# Patient Record
Sex: Male | Born: 1960 | Race: White | Hispanic: No | Smoking: Current every day smoker
Health system: Southern US, Community
[De-identification: ages and names within clinical notes are randomized; demographics above are authoritative.]

## PROBLEM LIST (undated history)

## (undated) DIAGNOSIS — Z950 Presence of cardiac pacemaker: Secondary | ICD-10-CM

## (undated) DIAGNOSIS — I2510 Atherosclerotic heart disease of native coronary artery without angina pectoris: Secondary | ICD-10-CM

## (undated) DIAGNOSIS — R0600 Dyspnea, unspecified: Secondary | ICD-10-CM

## (undated) DIAGNOSIS — J439 Emphysema, unspecified: Secondary | ICD-10-CM

## (undated) DIAGNOSIS — I700 Atherosclerosis of aorta: Secondary | ICD-10-CM

## (undated) DIAGNOSIS — I4891 Unspecified atrial fibrillation: Secondary | ICD-10-CM

## (undated) HISTORY — PX: INGUINAL HERNIA REPAIR: SUR1180

---

## 1985-06-24 HISTORY — PX: KNEE ARTHROSCOPY: SUR90

## 2020-12-29 ENCOUNTER — Observation Stay
Admission: EM | Admit: 2020-12-29 | Discharge: 2020-12-30 | Disposition: A | Discharge status: Home / Self Care | Payer: PRIVATE HEALTH INSURANCE | Attending: Hospitalist | Admitting: Hospitalist

## 2020-12-29 ENCOUNTER — Observation Stay: Payer: PRIVATE HEALTH INSURANCE

## 2020-12-29 ENCOUNTER — Observation Stay
Admit: 2020-12-29 | Discharge: 2020-12-29 | Disposition: A | Discharge status: Home / Self Care | Payer: PRIVATE HEALTH INSURANCE | Attending: Internal Medicine | Admitting: Internal Medicine

## 2020-12-29 ENCOUNTER — Other Ambulatory Visit: Payer: Self-pay

## 2020-12-29 DIAGNOSIS — R55 Syncope and collapse: Principal | ICD-10-CM

## 2020-12-29 DIAGNOSIS — R7401 Elevation of levels of liver transaminase levels: Secondary | ICD-10-CM

## 2020-12-29 DIAGNOSIS — R739 Hyperglycemia, unspecified: Secondary | ICD-10-CM | POA: Diagnosis present

## 2020-12-29 DIAGNOSIS — D72829 Elevated white blood cell count, unspecified: Secondary | ICD-10-CM | POA: Insufficient documentation

## 2020-12-29 DIAGNOSIS — F17200 Nicotine dependence, unspecified, uncomplicated: Secondary | ICD-10-CM | POA: Diagnosis present

## 2020-12-29 DIAGNOSIS — D72823 Leukemoid reaction: Secondary | ICD-10-CM

## 2020-12-29 DIAGNOSIS — F17210 Nicotine dependence, cigarettes, uncomplicated: Secondary | ICD-10-CM | POA: Insufficient documentation

## 2020-12-29 DIAGNOSIS — R748 Abnormal levels of other serum enzymes: Secondary | ICD-10-CM | POA: Insufficient documentation

## 2020-12-29 DIAGNOSIS — R7303 Prediabetes: Secondary | ICD-10-CM | POA: Insufficient documentation

## 2020-12-29 DIAGNOSIS — R778 Other specified abnormalities of plasma proteins: Secondary | ICD-10-CM | POA: Diagnosis present

## 2020-12-29 DIAGNOSIS — Z20822 Contact with and (suspected) exposure to covid-19: Secondary | ICD-10-CM | POA: Insufficient documentation

## 2020-12-29 LAB — CBC
HCT: 39.2 % (ref 39.0–52.0)
Hemoglobin: 13.1 g/dL (ref 13.0–17.0)
MCH: 31.1 pg (ref 26.0–34.0)
MCHC: 33.4 g/dL (ref 30.0–36.0)
MCV: 93.1 fL (ref 80.0–100.0)
Platelets: 211 10*3/uL (ref 150–400)
RBC: 4.21 MIL/uL — ABNORMAL LOW (ref 4.22–5.81)
RDW: 14.1 % (ref 11.5–15.5)
WBC: 15 10*3/uL — ABNORMAL HIGH (ref 4.0–10.5)
nRBC: 0 % (ref 0.0–0.2)

## 2020-12-29 LAB — COMPREHENSIVE METABOLIC PANEL
ALT: 262 U/L — ABNORMAL HIGH (ref 0–44)
AST: 284 U/L — ABNORMAL HIGH (ref 15–41)
Albumin: 3.5 g/dL (ref 3.5–5.0)
Alkaline Phosphatase: 57 U/L (ref 38–126)
Anion gap: 7 (ref 5–15)
BUN: 17 mg/dL (ref 6–20)
CO2: 23 mmol/L (ref 22–32)
Calcium: 8.2 mg/dL — ABNORMAL LOW (ref 8.9–10.3)
Chloride: 106 mmol/L (ref 98–111)
Creatinine, Ser: 1.16 mg/dL (ref 0.61–1.24)
GFR, Estimated: >60 mL/min (ref >60)
Glucose, Bld: 213 mg/dL — ABNORMAL HIGH (ref 70–99)
Potassium: 3.7 mmol/L (ref 3.5–5.1)
Sodium: 136 mmol/L (ref 135–145)
Total Bilirubin: 0.4 mg/dL (ref 0.3–1.2)
Total Protein: 6.2 g/dL — ABNORMAL LOW (ref 6.5–8.1)

## 2020-12-29 LAB — GLUCOSE, CAPILLARY
Glucose-Capillary: 81 mg/dL (ref 70–99)
Glucose-Capillary: 97 mg/dL (ref 70–99)
Glucose-Capillary: 97 mg/dL (ref 70–99)

## 2020-12-29 LAB — ECHOCARDIOGRAM COMPLETE
AR max vel: 4.9 cm2
AV Area VTI: 4.61 cm2
AV Area mean vel: 5.14 cm2
AV Mean grad: 3 mmHg
AV Peak grad: 4.8 mmHg
Ao pk vel: 1.1 m/s
Area-P 1/2: 7.29 cm2
Height: 71 in
MV VTI: 5.13 cm2
S' Lateral: 2.5 cm
Weight: 2400 oz

## 2020-12-29 LAB — MAGNESIUM: Magnesium: 1.9 mg/dL (ref 1.7–2.4)

## 2020-12-29 LAB — HEMOGLOBIN A1C
Hgb A1c MFr Bld: 5.8 % — ABNORMAL HIGH (ref 4.8–5.6)
Mean Plasma Glucose: 119.76 mg/dL

## 2020-12-29 LAB — TROPONIN I (HIGH SENSITIVITY)
Troponin I (High Sensitivity): 108 ng/L (ref <18)
Troponin I (High Sensitivity): 340 ng/L (ref <18)
Troponin I (High Sensitivity): 430 ng/L (ref <18)
Troponin I (High Sensitivity): 312 ng/L (ref <18)

## 2020-12-29 LAB — HIV ANTIBODY (ROUTINE TESTING W REFLEX): HIV Screen 4th Generation wRfx: NONREACTIVE

## 2020-12-29 LAB — RESP PANEL BY RT-PCR (FLU A&B, COVID) ARPGX2
Influenza A by PCR: NEGATIVE
Influenza B by PCR: NEGATIVE
SARS Coronavirus 2 by RT PCR: NEGATIVE

## 2020-12-29 MED ORDER — ACETAMINOPHEN 325 MG PO TABS: 650.0000 mg | ORAL_TABLET | Freq: Four times a day (QID) | ORAL | Status: DC | PRN

## 2020-12-29 MED ORDER — SODIUM CHLORIDE 0.9% FLUSH
3.0000 mL | Freq: Two times a day (BID) | INTRAVENOUS | Status: DC
  Administered 2020-12-29 (×2): 3 mL via INTRAVENOUS

## 2020-12-29 MED ORDER — ASPIRIN 81 MG PO CHEW
324.0000 mg | CHEWABLE_TABLET | Freq: Once | ORAL | Status: AC
  Administered 2020-12-29: 324 mg via ORAL
  Filled 2020-12-29: qty 4

## 2020-12-29 MED ORDER — ACETAMINOPHEN 650 MG RE SUPP: 650.0000 mg | Freq: Four times a day (QID) | RECTAL | Status: DC | PRN

## 2020-12-29 MED ORDER — ONDANSETRON HCL 4 MG/2ML IJ SOLN: 4.0000 mg | Freq: Four times a day (QID) | INTRAMUSCULAR | Status: DC | PRN

## 2020-12-29 MED ORDER — SODIUM CHLORIDE 0.9 % IV BOLUS
1000.0000 mL | Freq: Once | INTRAVENOUS | Status: AC
  Administered 2020-12-29: 1000 mL via INTRAVENOUS

## 2020-12-29 MED ORDER — SODIUM CHLORIDE 0.9 % IV SOLN: INTRAVENOUS | Status: DC

## 2020-12-29 MED ORDER — ONDANSETRON HCL 4 MG PO TABS
4.0000 mg | ORAL_TABLET | Freq: Four times a day (QID) | ORAL | Status: DC | PRN
Start: 2020-12-29 — End: 2020-12-30

## 2020-12-29 MED ORDER — ENOXAPARIN SODIUM 40 MG/0.4ML IJ SOSY
40.0000 mg | PREFILLED_SYRINGE | INTRAMUSCULAR | Status: DC
  Administered 2020-12-29: 40 mg via SUBCUTANEOUS
  Filled 2020-12-29: qty 0.4

## 2020-12-29 MED ORDER — ASPIRIN EC 81 MG PO TBEC
81.0000 mg | DELAYED_RELEASE_TABLET | Freq: Every day | ORAL | Status: DC
  Administered 2020-12-29 – 2020-12-30 (×2): 81 mg via ORAL
  Filled 2020-12-29 (×2): qty 1

## 2020-12-29 NOTE — ED Triage Notes (Addendum)
Pt presents to the ER via EMS s/p a syncopal episode. Per EMS, pt was getting ready for work became dizzy and passed out. Orthostatic hypotensive with EMS. Pt has cardiac hx. Denies CP and SOB prior to incident.

## 2020-12-29 NOTE — ED Notes (Signed)
Report called to Kelly, RN

## 2020-12-29 NOTE — ED Notes (Signed)
ED Tech at bedside for transport

## 2020-12-29 NOTE — ED Notes (Signed)
Pt given urinal.

## 2020-12-29 NOTE — Consult Note (Signed)
[facility] Consultation Note  Patient ID: Eric Allison, MRN: [id number], DOB/AGE: Nov 19, 1960 60 y.o. Admit date: 12/29/2020   Date of Consult: 12/29/2020 Primary Physician: Pcp, No Primary Cardiologist: None  Chief Complaint:  Chief Complaint  Patient presents with   Dizziness   Loss of Consciousness   Reason for Consult:  Syncope  HPI: 60 y.o. male with no evidence of previous cardiovascular risk factors and no evidence of previous cardiovascular events having significant longstanding tobacco abuse without evidence of family history of cardiovascular disease having a syncopal episode.  The patient has been doing a vigorous job with no evidence of concerns of shortness of breath chest pain PND orthopnea dizziness or nausea.  He had come home from his work and was in the yard helping the dogs and had a syncopal episode and did not feel well.  He additionally was trying to get into his jeep and continued to have presyncope and syncope.  There was no evidence of chest pain shortness of breath or prolonged issues at that time.  When seen in the emergency room he had gotten some IV fluids and felt much better and otherwise had no other symptoms since that time.  He has had an echocardiogram showing normal LV systolic function and no evidence of significant valvular heart disease.  Telemetry has shown sinus bradycardia for with first-degree AV block and bundle branch block.  EKG has shown normal sinus rhythm first-degree AV block with left axis deviation and right bundle branch block apparently unchanged by his history for many years of ago.  Troponin has been elevating from 108 up to 430 and now back to 312 possibly consistent with myocardial injury.  Currently the patient feels well  History reviewed. No pertinent past medical history.    Surgical History: History reviewed. No pertinent surgical history.   Home Meds: Prior to Admission medications   Not on File    Inpatient  Medications:   aspirin EC  81 mg Oral Daily   enoxaparin (LOVENOX) injection  40 mg Subcutaneous Q24H   sodium chloride flush  3 mL Intravenous Q12H    sodium chloride 100 mL/hr at 12/29/20 0957    Allergies: Not on File  Social History   Socioeconomic History   Marital status: Divorced    Spouse name: Not on file   Number of children: Not on file   Years of education: Not on file   Highest education level: Not on file  Occupational History   Not on file  Tobacco Use   Smoking status: Every Day    Packs/day: 1.00    Pack years: 0.00    Types: Cigarettes   Smokeless tobacco: Not on file  Substance and Sexual Activity   Alcohol use: Yes    Comment: occ   Drug use: Never   Sexual activity: Not on file  Other Topics Concern   Not on file  Social History Narrative   Not on file   Social Determinants of Health   Financial Resource Strain: Not on file  Food Insecurity: Not on file  Transportation Needs: Not on file  Physical Activity: Not on file  Stress: Not on file  Social Connections: Not on file  Intimate Partner Violence: Not on file     Family History  Problem Relation Age of Onset   Hypertension Mother      Review of Systems Positive for syncope Negative for: General:  chills, fever, night sweats or weight changes.  Cardiovascular: PND orthopnea  positive for syncope negative for dizziness  Dermatological skin lesions rashes Respiratory: Cough congestion Urologic: Frequent urination urination at night and hematuria Abdominal: negative for nausea, vomiting, diarrhea, bright red blood per rectum, melena, or hematemesis Neurologic: negative for visual changes, and/or hearing changes  All other systems reviewed and are otherwise negative except as noted above.  Labs: No results for input(s): CKTOTAL, CKMB, TROPONINI in the last 72 hours. Lab Results  Component Value Date   WBC 15.0 (H) 12/29/2020   HGB 13.1 12/29/2020   HCT 39.2 12/29/2020   MCV 93.1  12/29/2020   PLT 211 12/29/2020    Recent Labs  Lab 12/29/20 0418  NA 136  K 3.7  CL 106  CO2 23  BUN 17  CREATININE 1.16  CALCIUM 8.2*  PROT 6.2*  BILITOT 0.4  ALKPHOS 57  ALT 262*  AST 284*  GLUCOSE 213*   No results found for: CHOL, HDL, LDLCALC, TRIG No results found for: DDIMER  Radiology/Studies:  ECHOCARDIOGRAM COMPLETE  Result Date: 12/29/2020    ECHOCARDIOGRAM REPORT   Patient Name:   Eric Allison Date of Exam: 12/29/2020 Medical Rec #:  [id number]       Height:       71.0 in Accession #:    [id number]      Weight:       150.0 lb Date of Birth:  1960/10/14       BSA:          1.866 m Patient Age:    63 years        BP:           110/72 mmHg Patient Gender: M               HR:           73 bpm. Exam Location:  [facility] Procedure: 2D Echo, Color Doppler and Cardiac Doppler Indications:     R55 Syncope  History:         Patient has no prior history of Echocardiogram examinations.                  Risk Factors:Current Smoker.  Sonographer:     Charmayne Sheer RDCS (AE) Referring Phys:  [id number] Collier Bullock Diagnosing Phys: Serafina Royals MD  Sonographer Comments: Technically challenging study due to limited acoustic windows. Image acquisition challenging due to patient body habitus. IMPRESSIONS  1. Left ventricular ejection fraction, by estimation, is 65 to 70%. The left ventricle has normal function. The left ventricle has no regional wall motion abnormalities. Left ventricular diastolic parameters were normal.  2. Right ventricular systolic function is normal. The right ventricular size is normal.  3. The mitral valve is normal in structure. Trivial mitral valve regurgitation.  4. The aortic valve is normal in structure. Aortic valve regurgitation is trivial. FINDINGS  Left Ventricle: Left ventricular ejection fraction, by estimation, is 65 to 70%. The left ventricle has normal function. The left ventricle has no regional wall motion abnormalities. The left ventricular internal cavity size  was small. There is no left ventricular hypertrophy. Left ventricular diastolic parameters were normal. Right Ventricle: The right ventricular size is normal. No increase in right ventricular wall thickness. Right ventricular systolic function is normal. Left Atrium: Left atrial size was normal in size. Right Atrium: Right atrial size was normal in size. Pericardium: There is no evidence of pericardial effusion. Mitral Valve: The mitral valve is normal in structure. Trivial mitral valve regurgitation. MV peak gradient, 1.6 mmHg.  The mean mitral valve gradient is 1.0 mmHg. Tricuspid Valve: The tricuspid valve is normal in structure. Tricuspid valve regurgitation is trivial. Aortic Valve: The aortic valve is normal in structure. Aortic valve regurgitation is trivial. Aortic valve mean gradient measures 3.0 mmHg. Aortic valve peak gradient measures 4.8 mmHg. Aortic valve area, by VTI measures 4.61 cm. Pulmonic Valve: The pulmonic valve was normal in structure. Pulmonic valve regurgitation is not visualized. Aorta: The aortic root and ascending aorta are structurally normal, with no evidence of dilitation. IAS/Shunts: No atrial level shunt detected by color flow Doppler.  LEFT VENTRICLE PLAX 2D LVIDd:         3.80 cm  Diastology LVIDs:         2.50 cm  LV e' medial:    8.16 cm/s LV PW:         1.20 cm  LV E/e' medial:  7.8 LV IVS:        1.00 cm  LV e' lateral:   9.25 cm/s LVOT diam:     2.90 cm  LV E/e' lateral: 6.9 LV SV:         96 LV SV Index:   52 LVOT Area:     6.61 cm  LEFT ATRIUM         Index LA diam:    2.90 cm 1.55 cm/m  AORTIC VALVE                   PULMONIC VALVE AV Area (Vmax):    4.90 cm    PV Vmax:       1.09 m/s AV Area (Vmean):   5.14 cm    PV Vmean:      73.200 cm/s AV Area (VTI):     4.61 cm    PV VTI:        0.189 m AV Vmax:           110.00 cm/s PV Peak grad:  4.8 mmHg AV Vmean:          77.500 cm/s PV Mean grad:  3.0 mmHg AV VTI:            0.209 m AV Peak Grad:      4.8 mmHg AV Mean Grad:       3.0 mmHg LVOT Vmax:         81.60 cm/s LVOT Vmean:        60.300 cm/s LVOT VTI:          0.146 m LVOT/AV VTI ratio: 0.70  AORTA Ao Root diam: 3.35 cm MITRAL VALVE MV Area (PHT): 7.29 cm    SHUNTS MV Area VTI:   5.13 cm    Systemic VTI:  0.15 m MV Peak grad:  1.6 mmHg    Systemic Diam: 2.90 cm MV Mean grad:  1.0 mmHg MV Vmax:       0.64 m/s MV Vmean:      41.2 cm/s MV Decel Time: 104 msec MV E velocity: 63.80 cm/s MV A velocity: 80.10 cm/s MV E/A ratio:  0.80 Serafina Royals MD Electronically signed by Serafina Royals MD Signature Date/Time: 12/29/2020/1:09:29 PM    Final    US Abdomen Limited RUQ (LIVER/GB)  Result Date: 12/29/2020 CLINICAL DATA:  Transaminitis EXAM: ULTRASOUND ABDOMEN LIMITED RIGHT UPPER QUADRANT COMPARISON:  None. FINDINGS: Gallbladder: No gallstones or wall thickening visualized. No sonographic Murphy sign noted by sonographer. Common bile duct: Diameter: 2.4 mm. Liver: There are 2 simple cyst within the right hepatic lobe both  of which measure up to 1.2 cm in diameter. No solid hepatic lesion identified. Within normal limits in parenchymal echogenicity. Portal vein is patent on color Doppler imaging with normal direction of blood flow towards the liver. Other: None. IMPRESSION: 1. Essentially unremarkable ultrasound of the right upper quadrant. 2. Incidental note of two small simple cysts within the right hepatic lobe. Electronically Signed   By: Davina Poke D.O.   On: 12/29/2020 10:20    EKG: Normal sinus rhythm with first-degree AV block left axis deviation and right bundle branch block  Weights: Filed Weights   12/29/20 0413 12/29/20 1218  Weight: 68 kg 65.2 kg     Physical Exam: Blood pressure 121/73, pulse 62, temperature 98.2 F (36.8 C), resp. rate 18, height 5\' 11"  (1.803 m), weight 65.2 kg, SpO2 99 %. Body mass index is 20.06 kg/m. General: Well developed, well nourished, in no acute distress. Head eyes ears nose throat: Normocephalic, atraumatic, sclera  non-icteric, no xanthomas, nares are without discharge. No apparent thyromegaly and/or mass  Lungs: Normal respiratory effort.  no wheezes, no rales, no rhonchi.  Heart: RRR with normal S1 S2. no murmur gallop, no rub, PMI is normal size and placement, carotid upstroke normal without bruit, jugular venous pressure is normal Abdomen: Soft, non-tender, non-distended with normoactive bowel sounds. No hepatomegaly. No rebound/guarding. No obvious abdominal masses. Abdominal aorta is normal size without bruit Extremities: No edema. no cyanosis, no clubbing, no ulcers  Peripheral : 2+ bilateral upper extremity pulses, 2+ bilateral femoral pulses, 2+ bilateral dorsal pedal pulse Neuro: Alert and oriented. No facial asymmetry. No focal deficit. Moves all extremities spontaneously. Musculoskeletal: Normal muscle tone without kyphosis Psych:  Responds to questions appropriately with a normal affect.    Assessment: 60 year old male with no evidence of previous cardiovascular history or risk factors other than tobacco abuse having syncopal episode with a normal LV function by echocardiogram but elevated troponin now feeling well with an abnormal EKG suspicious for cardiac origin  Plan: 1.  Continue heparin subcutaneously for further risk reduction of deep venous thrombosis as well as myocardial infarction 2.  Continue telemetry for further evaluation treatment options of rhythm disturbances possibly causing above 3.  Further consideration of carotid Doppler for syncopal episode 4.  Begin ambulation and follow-up for improvements of symptoms and possible further investigation as necessary based on potential symptoms and above.  This could include the possibility of cardiac catheterization.  Patient understands risk and benefits of cardiac catheterization this includes the possibility of death stroke heart attack infection bleeding or blood clot.  He is at low risk for conscious sedation  Signed, Corey Skains M.D. [facility] 12/29/2020, 4:52 PM

## 2020-12-29 NOTE — ED Notes (Signed)
Transport requested

## 2020-12-29 NOTE — ED Notes (Addendum)
MD Damita Dunnings aware of troponin increase

## 2020-12-29 NOTE — ED Notes (Signed)
Patient transported to Ultrasound 

## 2020-12-29 NOTE — ED Notes (Signed)
ED Provider at bedside. 

## 2020-12-29 NOTE — H&P (Addendum)
History and Physical    [name] ZLD:[id number] DOB: [birth year] DOA: 12/29/2020  PCP: Pcp, No   Patient coming from: Home  I have personally briefly reviewed patient's old medical records in [place]  Chief Complaint: " I passed out"  HPI: Eric Allison is a 60 y.o. male with no significant past medical history except nicotine dependence who presents to the ER via EMS after he had 2 syncopal episodes at home. 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  He was walking across the lawn to his truck when he suddenly felt dizzy and fell.  He is unsure how long he was out for but woke up and found himself on the ground.  He walked back into the house and while talking to his mother he had another dizzy spell and passed out.  His mother called 2.  When EMS arrived patient was said to have orthostatic blood pressure changes (not documented.)  Patient states he had some urinary incontinence but denies having any tongue bite or fecal incontinence. He denied having any chest pain or shortness of breath prior to his syncopal episodes.  He denies having any palpitations, no nausea, no vomiting, no fever, no chills, no abdominal pain, no urinary symptoms, no diaphoresis, no headache, no blurred vision, no focal deficits. Labs show sodium 136, potassium 3.7, chloride 106, bicarb 23, glucose 213, BUN 17, creatinine 1.16, calcium 8.2, alkaline phosphatase 57, albumin 3.5, AST 284, ALT 262, total protein 6.2, troponin 108 >> 340, white count 15, hemoglobin 13.1, hematocrit 39.2, MCV 93.1, RDW 14.1, platelet count 211 Respiratory viral panel is negative EKG reviewed by me shows sinus rhythm followed by junctional rhythm at times around 82 bpm with a widened QRS, right axis deviation, prolonged PR interval, mild QTC prolongation with nonspecific ST changes.    ED Course: Patient is a 60 year old male who presents to the ER for evaluation of 2 syncopal  episodes.  Patient is noted to have hyperglycemia, transaminitis, elevated troponin and abnormal EKG. He will be referred to observation status for further evaluation.     Review of Systems: As per HPI otherwise all other systems reviewed and negative.    History reviewed. No pertinent past medical history.  History reviewed. No pertinent surgical history.   reports that he has been smoking cigarettes. He has been smoking an average of 1.00 packs per day. He does not have any smokeless tobacco history on file. He reports current alcohol use. He reports that he does not use drugs.  Not on File  Family History  Problem Relation Age of Onset   Hypertension Mother       Prior to Admission medications   Not on File    Physical Exam: Vitals:   12/29/20 0530 12/29/20 0700 12/29/20 0730 12/29/20 0800  BP: 120/71 114/65 112/73 110/72  Pulse: 71 71 70 75  Resp: 18 17 17 18   Temp:      TempSrc:      SpO2: 96% 95% 95% 94%  Weight:      Height:         Vitals:   12/29/20 0530 12/29/20 0700 12/29/20 0730 12/29/20 0800  BP: 120/71 114/65 112/73 110/72  Pulse: 71 71 70 75  Resp: 18 17 17 18   Temp:      TempSrc:      SpO2: 96% 95% 95% 94%  Weight:      Height:  Constitutional: Alert and oriented x 3 . Not in any apparent distress HEENT:      Head: Normocephalic and atraumatic.         Eyes: PERLA, EOMI, Conjunctivae are normal. Sclera is non-icteric.       Mouth/Throat: Mucous membranes are moist.       Neck: Supple with no signs of meningismus. Cardiovascular: Regular rate and rhythm. No murmurs, gallops, or rubs. 2+ symmetrical distal pulses are present . No JVD. No LE edema Respiratory: Respiratory effort normal .Lungs sounds clear bilaterally. No wheezes, crackles, or rhonchi.  Gastrointestinal: Soft, non tender, and non distended with positive bowel sounds.  Genitourinary: No CVA tenderness. Musculoskeletal: Nontender with normal range of motion in all  extremities. No cyanosis, or erythema of extremities. Neurologic:  Face is symmetric. Moving all extremities. No gross focal neurologic deficits . Skin: Skin is warm, dry.  No rash or ulcers.  Skin tears on right forearm and forehead Psychiatric: Mood and affect are normal    Labs on Admission: I have personally reviewed following labs and imaging studies  CBC: Recent Labs  Lab 12/29/20 0418  WBC 15.0*  HGB 13.1  HCT 39.2  MCV 93.1  PLT 353   Basic Metabolic Panel: Recent Labs  Lab 12/29/20 0418  NA 136  K 3.7  CL 106  CO2 23  GLUCOSE 213*  BUN 17  CREATININE 1.16  CALCIUM 8.2*   GFR: Estimated Creatinine Clearance: 65.9 mL/min (by C-G formula based on SCr of 1.16 mg/dL). Liver Function Tests: Recent Labs  Lab 12/29/20 0418  AST 284*  ALT 262*  ALKPHOS 57  BILITOT 0.4  PROT 6.2*  ALBUMIN 3.5   No results for input(s): LIPASE, AMYLASE in the last 168 hours. No results for input(s): AMMONIA in the last 168 hours. Coagulation Profile: No results for input(s): INR, PROTIME in the last 168 hours. Cardiac Enzymes: No results for input(s): CKTOTAL, CKMB, CKMBINDEX, TROPONINI in the last 168 hours. BNP (last 3 results) No results for input(s): PROBNP in the last 8760 hours. HbA1C: No results for input(s): HGBA1C in the last 72 hours. CBG: No results for input(s): GLUCAP in the last 168 hours. Lipid Profile: No results for input(s): CHOL, HDL, LDLCALC, TRIG, CHOLHDL, LDLDIRECT in the last 72 hours. Thyroid Function Tests: No results for input(s): TSH, T4TOTAL, FREET4, T3FREE, THYROIDAB in the last 72 hours. Anemia Panel: No results for input(s): VITAMINB12, FOLATE, FERRITIN, TIBC, IRON, RETICCTPCT in the last 72 hours. Urine analysis: No results found for: COLORURINE, APPEARANCEUR, LABSPEC, PHURINE, GLUCOSEU, HGBUR, BILIRUBINUR, KETONESUR, PROTEINUR, UROBILINOGEN, NITRITE, LEUKOCYTESUR  Radiological Exams on Admission: No results  found.   Assessment/Plan Principal Problem:   Syncope Active Problems:   Nicotine dependence   Transaminitis   Leukemoid reaction   Hyperglycemia     Syncope Unclear etiology Will place patient on cardiac monitor to rule out arrhythmias Consult cardiology for evaluation since EKG is abnormal and shows a prolonged PR interval as well as widened QRS Obtain 2D echocardiogram to assess LVEF and rule out aortic stenosis    Hyperglycemia Patient noted to have a fasting blood sugar of 213 No known history of diabetes mellitus We will obtain a hemoglobin A1c Blood sugar checks with meals    Transaminitis Unclear etiology He denies alcohol use and does not take any medications chronically Obtain CK levels Obtain abdominal ultrasound     Nicotine dependence Patient smokes 1 pack of cigarettes daily Smoking cessation was discussed with him in detail but he  declines a nicotine transdermal patch at this time    Elevated troponin Patient denies having any chest pain or shortness of breath and does not have a known history of coronary artery disease He has a bump in his troponin from 108 >> 340 Obtain 2D echocardiogram to rule out regional wall motion abnormality Place patient on aspirin 81 mg daily Request cardiology consult     Leukocytosis Most likely secondary to stress-induced leukemoid reaction  DVT prophylaxis: Lovenox  Code Status: full code  Family Communication: Greater than 50% of time was spent discussing patient's condition and plan of care with him at the bedside.  All questions and concerns have been addressed.  He verbalizes understanding and agrees with the plan. Disposition Plan: Back to previous home environment Consults called: Cardiology Status: Observation    Mayte Diers MD [facility]     12/29/2020, 8:59 AM

## 2020-12-29 NOTE — Progress Notes (Signed)
*  PRELIMINARY RESULTS* Echocardiogram 2D Echocardiogram has been performed.  Eric Allison 12/29/2020, 11:56 AM

## 2020-12-29 NOTE — ED Provider Notes (Signed)
[facility] Emergency Department Provider Note  Time seen: 4:30 AM  I have reviewed the triage vital signs and the nursing notes.   HISTORY  Chief Complaint Dizziness and Loss of Consciousness   HPI Eric Allison is a 60 y.o. male with no significant past medical history, takes no medications, presents to the emergency department for syncopal episode.  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  Patient denies any chest pain or shortness of breath at any time.  No recent fever cough congestion.  Patient states he was told when he was 60 years old after getting an EKG that his heart electrical circuit runs backwards.  Patient states he does not follow-up with a doctor regarding this.  Does not take any medications on a daily basis.   History reviewed. No pertinent past medical history.  There are no problems to display for this patient.   History reviewed. No pertinent surgical history.  Prior to Admission medications   Not on File    Not on File  History reviewed. No pertinent family history.  Social History Social History   Tobacco Use   Smoking status: Every Day    Packs/day: 1.00    Pack years: 0.00    Types: Cigarettes  Substance Use Topics   Alcohol use: Yes    Comment: occ   Drug use: Never    Review of Systems Constitutional: Negative for fever. Cardiovascular: Negative for chest pain.  Positive for syncopal event. Respiratory: Negative for shortness of breath. Gastrointestinal: Negative for abdominal pain Musculoskeletal: Negative for musculoskeletal complaints Neurological: Negative for headache All other ROS negative  ____________________________________________   PHYSICAL EXAM:  VITAL SIGNS: ED Triage Vitals  Enc Vitals Group     BP 12/29/20 0414 128/63     Pulse Rate 12/29/20 0414 86     Resp 12/29/20 0414 18     Temp 12/29/20 0414  98 F (36.7 C)     Temp Source 12/29/20 0414 Oral     SpO2 12/29/20 0414 97 %     Weight 12/29/20 0413 150 lb (68 kg)     Height 12/29/20 0413 5\' 11"  (1.803 m)     Head Circumference --      Peak Flow --      Pain Score 12/29/20 0415 0     Pain Loc --      Pain Edu? --      Excl. in [place]? --    Constitutional: Alert and oriented. Well appearing and in no distress. Eyes: Normal exam ENT      Head: Normocephalic and atraumatic.      Mouth/Throat: Mucous membranes are moist. Cardiovascular: Normal rate, regular rhythm.  Respiratory: Normal respiratory effort without tachypnea nor retractions. Breath sounds are clear  Gastrointestinal: Soft and nontender. No distention.  Musculoskeletal: Nontender with normal range of motion in all extremities. Neurologic:  Normal speech and language. No gross focal neurologic deficits  Skin:  Skin is warm, dry and intact.  Psychiatric: Mood and affect are normal.  ____________________________________________  EKG viewed and interpreted by myself shows what appears to be a sinus rhythm at times followed by junctional rhythm at times around 82 bpm with a widened QRS, right axis deviation, prolonged PR interval, mild QTC prolongation with nonspecific ST changes.  INITIAL IMPRESSION / ASSESSMENT AND PLAN / ED COURSE  Pertinent labs & imaging results that  were available during my care of the patient were reviewed by me and considered in my medical decision making (see chart for details).   Patient presents emergency department for lightheadedness and brief syncopal episode that occurred this morning while getting ready for work.  Here the patient denies any symptoms.  States he feels normal.  Patient's EKG shows that appears to be a junctional rhythm at times.  With no old EKGs for comparison we will check labs including cardiac enzymes x2.  We will IV hydrate and continue to closely monitor while awaiting results.  Patient's troponin has resulted elevated.   Given the patient's syncopal event abnormal EKG and elevated troponin patient will be admitted to the hospital service for further work-up and treatment.  Patient agreeable to plan of care.  Eric Allison was evaluated in Emergency Department on 12/29/2020 for the symptoms described in the history of present illness. He was evaluated in the context of the global COVID-19 pandemic, which necessitated consideration that the patient might be at risk for infection with the SARS-CoV-2 virus that causes COVID-19. Institutional protocols and algorithms that pertain to the evaluation of patients at risk for COVID-19 are in a state of rapid change based on information released by regulatory bodies including the CDC and federal and state organizations. These policies and algorithms were followed during the patient's care in the ED.  ____________________________________________   FINAL CLINICAL IMPRESSION(S) / ED DIAGNOSES  Syncope Elevated troponin   Harvest Dark, MD 12/29/20 [phone]

## 2020-12-30 LAB — GLUCOSE, CAPILLARY: Glucose-Capillary: 100 mg/dL — ABNORMAL HIGH (ref 70–99)

## 2020-12-30 LAB — CBC
HCT: 37.3 % — ABNORMAL LOW (ref 39.0–52.0)
Hemoglobin: 12.8 g/dL — ABNORMAL LOW (ref 13.0–17.0)
MCH: 31.5 pg (ref 26.0–34.0)
MCHC: 34.3 g/dL (ref 30.0–36.0)
MCV: 91.9 fL (ref 80.0–100.0)
Platelets: 180 10*3/uL (ref 150–400)
RBC: 4.06 MIL/uL — ABNORMAL LOW (ref 4.22–5.81)
RDW: 14.2 % (ref 11.5–15.5)
WBC: 8.7 10*3/uL (ref 4.0–10.5)
nRBC: 0 % (ref 0.0–0.2)

## 2020-12-30 LAB — BASIC METABOLIC PANEL
Anion gap: 6 (ref 5–15)
BUN: 9 mg/dL (ref 6–20)
CO2: 23 mmol/L (ref 22–32)
Calcium: 8 mg/dL — ABNORMAL LOW (ref 8.9–10.3)
Chloride: 109 mmol/L (ref 98–111)
Creatinine, Ser: 0.77 mg/dL (ref 0.61–1.24)
GFR, Estimated: >60 mL/min (ref >60)
Glucose, Bld: 95 mg/dL (ref 70–99)
Potassium: 3.8 mmol/L (ref 3.5–5.1)
Sodium: 138 mmol/L (ref 135–145)

## 2020-12-30 LAB — CK: Total CK: 147 U/L (ref 49–397)

## 2020-12-30 MED ORDER — ATORVASTATIN CALCIUM 40 MG PO TABS: 40.0000 mg | ORAL_TABLET | Freq: Every day | ORAL | 0 refills | Status: DC

## 2020-12-30 MED ORDER — ATORVASTATIN CALCIUM 20 MG PO TABS
40.0000 mg | ORAL_TABLET | Freq: Every day | ORAL | Status: DC
  Administered 2020-12-30: 40 mg via ORAL
  Filled 2020-12-30: qty 2

## 2020-12-30 MED ORDER — ASPIRIN 81 MG PO TBEC: 81.0000 mg | DELAYED_RELEASE_TABLET | Freq: Every day | ORAL | Status: AC

## 2020-12-30 MED ORDER — CLOPIDOGREL BISULFATE 75 MG PO TABS
75.0000 mg | ORAL_TABLET | Freq: Every day | ORAL | Status: DC
  Administered 2020-12-30: 75 mg via ORAL
  Filled 2020-12-30: qty 1

## 2020-12-30 MED ORDER — CLOPIDOGREL BISULFATE 75 MG PO TABS: 75.0000 mg | ORAL_TABLET | Freq: Every day | ORAL | 0 refills | Status: AC

## 2020-12-30 NOTE — Progress Notes (Signed)
Patient discharged to home. Tele and IV d/c'd. Patient verbalizes understanding of discharge instructions. 

## 2020-12-30 NOTE — Progress Notes (Signed)
[facility] Encounter Note  Patient: Eric Allison / Admit Date: 12/29/2020 / Date of Encounter: 12/30/2020, 8:09 AM   Subjective: Patient feels quite well at this time with no evidence of anginal symptoms syncope or rhythm disturbances.  EKG is unchanged and telemetry shows no evidence of change in bundle branch block at this time.  Patient claims that EKG is significantly abnormal from childhood.  Echocardiogram shows normal LV systolic function with ejection fraction of 55 to 60% with no evidence of significant dysfunction or other changes.  We have discussed that elevated troponin and syncope may be secondary to coronary artery disease although relatively stable at this time with no evidence of symptoms today.  The patient wishes to be discharged home with follow-up for which she understands all the risks and benefits of treatment in hospital and out of the hospital to care.  Review of Systems: Positive for: None Negative for: Vision change, hearing change, syncope, dizziness, nausea, vomiting,diarrhea, bloody stool, stomach pain, cough, congestion, diaphoresis, urinary frequency, urinary pain,skin lesions, skin rashes Others previously listed  Objective: Telemetry: Normal sinus rhythm with bundle branch block Physical Exam: Blood pressure 102/77, pulse 67, temperature 97.6 F (36.4 C), resp. rate 18, height 5\' 11"  (1.803 m), weight 65.2 kg, SpO2 98 %. Body mass index is 20.06 kg/m. General: Well developed, well nourished, in no acute distress. Head: Normocephalic, atraumatic, sclera non-icteric, no xanthomas, nares are without discharge. Neck: No apparent masses Lungs: Normal respirations with no wheezes, no rhonchi, no rales , no crackles   Heart: Regular rate and rhythm, normal S1 S2, no murmur, no rub, no gallop, PMI is normal size and placement, carotid upstroke normal without bruit, jugular venous pressure normal Abdomen: Soft, non-tender, non-distended with  normoactive bowel sounds. No hepatosplenomegaly. Abdominal aorta is normal size without bruit Extremities: No edema, no clubbing, no cyanosis, no ulcers,  Peripheral: 2+ radial, 2+ femoral, 2+ dorsal pedal pulses Neuro: Alert and oriented. Moves all extremities spontaneously. Psych:  Responds to questions appropriately with a normal affect.   Intake/Output Summary (Last 24 hours) at 12/30/2020 0809 Last data filed at 12/30/2020 8299 Gross per 24 hour  Intake 1963 ml  Output 2625 ml  Net -662 ml    Inpatient Medications:   aspirin EC  81 mg Oral Daily   enoxaparin (LOVENOX) injection  40 mg Subcutaneous Q24H   sodium chloride flush  3 mL Intravenous Q12H   Infusions:   sodium chloride 100 mL/hr at 12/30/20 0531    Labs: Recent Labs    12/29/20 0418 12/29/20 0956 12/30/20 0507  NA 136  --  138  K 3.7  --  3.8  CL 106  --  109  CO2 23  --  23  GLUCOSE 213*  --  95  BUN 17  --  9  CREATININE 1.16  --  0.77  CALCIUM 8.2*  --  8.0*  MG  --  1.9  --    Recent Labs    12/29/20 0418  AST 284*  ALT 262*  ALKPHOS 57  BILITOT 0.4  PROT 6.2*  ALBUMIN 3.5   Recent Labs    12/29/20 0418 12/30/20 0507  WBC 15.0* 8.7  HGB 13.1 12.8*  HCT 39.2 37.3*  MCV 93.1 91.9  PLT 211 180   No results for input(s): CKTOTAL, CKMB, TROPONINI in the last 72 hours. Invalid input(s): POCBNP Recent Labs    12/29/20 0418  HGBA1C 5.8*     Weights: [employer]  12/29/20 0413 12/29/20 1218  Weight: 68 kg 65.2 kg     Radiology/Studies:  ECHOCARDIOGRAM COMPLETE  Result Date: 12/29/2020    ECHOCARDIOGRAM REPORT   Patient Name:   Eric Allison Date of Exam: 12/29/2020 Medical Rec #:  [id number]       Height:       71.0 in Accession #:    [id number]      Weight:       150.0 lb Date of Birth:  10-Oct-1960       BSA:          1.866 m Patient Age:    60 years        BP:           110/72 mmHg Patient Gender: M               HR:           73 bpm. Exam Location:  [facility] Procedure: 2D Echo, Color  Doppler and Cardiac Doppler Indications:     R55 Syncope  History:         Patient has no prior history of Echocardiogram examinations.                  Risk Factors:Current Smoker.  Sonographer:     Charmayne Sheer RDCS (AE) Referring Phys:  [id number] Collier Bullock Diagnosing Phys: Serafina Royals MD  Sonographer Comments: Technically challenging study due to limited acoustic windows. Image acquisition challenging due to patient body habitus. IMPRESSIONS  1. Left ventricular ejection fraction, by estimation, is 65 to 70%. The left ventricle has normal function. The left ventricle has no regional wall motion abnormalities. Left ventricular diastolic parameters were normal.  2. Right ventricular systolic function is normal. The right ventricular size is normal.  3. The mitral valve is normal in structure. Trivial mitral valve regurgitation.  4. The aortic valve is normal in structure. Aortic valve regurgitation is trivial. FINDINGS  Left Ventricle: Left ventricular ejection fraction, by estimation, is 65 to 70%. The left ventricle has normal function. The left ventricle has no regional wall motion abnormalities. The left ventricular internal cavity size was small. There is no left ventricular hypertrophy. Left ventricular diastolic parameters were normal. Right Ventricle: The right ventricular size is normal. No increase in right ventricular wall thickness. Right ventricular systolic function is normal. Left Atrium: Left atrial size was normal in size. Right Atrium: Right atrial size was normal in size. Pericardium: There is no evidence of pericardial effusion. Mitral Valve: The mitral valve is normal in structure. Trivial mitral valve regurgitation. MV peak gradient, 1.6 mmHg. The mean mitral valve gradient is 1.0 mmHg. Tricuspid Valve: The tricuspid valve is normal in structure. Tricuspid valve regurgitation is trivial. Aortic Valve: The aortic valve is normal in structure. Aortic valve regurgitation is trivial. Aortic  valve mean gradient measures 3.0 mmHg. Aortic valve peak gradient measures 4.8 mmHg. Aortic valve area, by VTI measures 4.61 cm. Pulmonic Valve: The pulmonic valve was normal in structure. Pulmonic valve regurgitation is not visualized. Aorta: The aortic root and ascending aorta are structurally normal, with no evidence of dilitation. IAS/Shunts: No atrial level shunt detected by color flow Doppler.  LEFT VENTRICLE PLAX 2D LVIDd:         3.80 cm  Diastology LVIDs:         2.50 cm  LV e' medial:    8.16 cm/s LV PW:         1.20 cm  LV E/e' medial:  7.8 LV IVS:        1.00 cm  LV e' lateral:   9.25 cm/s LVOT diam:     2.90 cm  LV E/e' lateral: 6.9 LV SV:         96 LV SV Index:   52 LVOT Area:     6.61 cm  LEFT ATRIUM         Index LA diam:    2.90 cm 1.55 cm/m  AORTIC VALVE                   PULMONIC VALVE AV Area (Vmax):    4.90 cm    PV Vmax:       1.09 m/s AV Area (Vmean):   5.14 cm    PV Vmean:      73.200 cm/s AV Area (VTI):     4.61 cm    PV VTI:        0.189 m AV Vmax:           110.00 cm/s PV Peak grad:  4.8 mmHg AV Vmean:          77.500 cm/s PV Mean grad:  3.0 mmHg AV VTI:            0.209 m AV Peak Grad:      4.8 mmHg AV Mean Grad:      3.0 mmHg LVOT Vmax:         81.60 cm/s LVOT Vmean:        60.300 cm/s LVOT VTI:          0.146 m LVOT/AV VTI ratio: 0.70  AORTA Ao Root diam: 3.35 cm MITRAL VALVE MV Area (PHT): 7.29 cm    SHUNTS MV Area VTI:   5.13 cm    Systemic VTI:  0.15 m MV Peak grad:  1.6 mmHg    Systemic Diam: 2.90 cm MV Mean grad:  1.0 mmHg MV Vmax:       0.64 m/s MV Vmean:      41.2 cm/s MV Decel Time: 104 msec MV E velocity: 63.80 cm/s MV A velocity: 80.10 cm/s MV E/A ratio:  0.80 Serafina Royals MD Electronically signed by Serafina Royals MD Signature Date/Time: 12/29/2020/1:09:29 PM    Final    US Abdomen Limited RUQ (LIVER/GB)  Result Date: 12/29/2020 CLINICAL DATA:  Transaminitis EXAM: ULTRASOUND ABDOMEN LIMITED RIGHT UPPER QUADRANT COMPARISON:  None. FINDINGS: Gallbladder: No  gallstones or wall thickening visualized. No sonographic Murphy sign noted by sonographer. Common bile duct: Diameter: 2.4 mm. Liver: There are 2 simple cyst within the right hepatic lobe both of which measure up to 1.2 cm in diameter. No solid hepatic lesion identified. Within normal limits in parenchymal echogenicity. Portal vein is patent on color Doppler imaging with normal direction of blood flow towards the liver. Other: None. IMPRESSION: 1. Essentially unremarkable ultrasound of the right upper quadrant. 2. Incidental note of two small simple cysts within the right hepatic lobe. Electronically Signed   By: Davina Poke D.O.   On: 12/29/2020 10:20     Assessment and Recommendation  60 y.o. male with no evidence of previous cardiovascular history having an episode of syncope with no evidence of chest discomfort rhythm disturbances but chronically abnormal EKG and mild elevation in troponin without evidence of apparent acute coronary syndrome although some concerns of cardiovascular risk 1.  Due to patient's adamant wishes to be discharged to home the patient may be medically managed for current issues.  This includes  using Plavix 75 mg each day with aspirin.  Will avoid beta-blocker due to bradycardia and no additional antihypertensive medication management due to lower blood pressure.  We will add atorvastatin as well for treatment options of cardiovascular risk.  If ambulating well the patient will be okay for discharged home with follow-up next week for further diagnostic testing and treatment options  Signed, Serafina Royals M.D. FACC

## 2020-12-30 NOTE — Discharge Summary (Signed)
Physician Discharge Summary   Eric Allison  male DOB: 05-07-1961  ZOX:[id number]  PCP: Pcp, No  Admit date: 12/29/2020 Discharge date: 12/30/2020  Admitted From: home Disposition:  home CODE STATUS: Full code  Discharge Instructions     Discharge instructions   Complete by: As directed    Since you want to go home, cardiologist cleared you for discharge but would like you to follow up with them in outpatient clinic.  Cardiology has prescribed you aspirin, plavix and Lipitor for prevention of coronary artery disease.   Dr. Enzo Bi [facility] Course:  For full details, please see H&P, progress notes, consult notes and ancillary notes.  Briefly,  [name] is a 60 y.o. male with no significant past medical history except nicotine dependence who presents to the ER via EMS after he had 2 syncopal episodes at home. 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  He was walking across the lawn to his truck when he suddenly felt dizzy and fell.  He is unsure how long he was out for but woke up and found himself on the ground.  Syncope --no clear etiology, though maybe due to some dehydration, as Cr decreased with IVF.  Echocardiogram shows normal LV systolic function with ejection fraction of 55 to 60% with no evidence of significant dysfunction or other changes.  Cardiology was consulted on admission and cleared pt for discharged with outpatient cardiology followup.  Elevated troponin Patient denies having any chest pain or shortness of breath. Cardiology consulted and deemed elevated troponin may be secondary to coronary artery disease.  Pt was started on aspirin, plavix and Lipitor for prevention of coronary artery disease by cardiology.   Hyperglycemia Pre-diabetes Patient noted to have a fasting blood sugar of 213 --A1c 5.8.   --Outpatient followup.   Transaminitis Unclear etiology He denies alcohol use and does not  take any medications chronically --abdominal ultrasound unremarkable   Nicotine dependence Patient smokes 1 pack of cigarettes daily.  Nicotine patch declined.   Leukocytosis  Likely due to hemoconcentration.  WBC count normalized the next day with IVF.   Discharge Diagnoses:  Principal Problem:   Syncope Active Problems:   Nicotine dependence   Elevated troponin   Leukemoid reaction   Hyperglycemia     Discharge Instructions:  Allergies as of 12/30/2020   Not on File      Medication List     TAKE these medications    aspirin 81 MG EC tablet Take 1 tablet (81 mg total) by mouth daily. Swallow whole. Start taking on: December 31, 2020   atorvastatin 40 MG tablet Commonly known as: LIPITOR Take 1 tablet (40 mg total) by mouth daily. Start taking on: December 31, 2020   clopidogrel 75 MG tablet Commonly known as: PLAVIX Take 1 tablet (75 mg total) by mouth daily. Prescribed by cardiologist for prevention. Start taking on: December 31, 2020         Follow-up Information     Corey Skains, MD Follow up in 1 week(s).   Specialty: Cardiology Contact information: [address] [facility] [place]  [address] [phone]                 Not on File   The results of significant diagnostics from this hospitalization (including imaging, microbiology, ancillary and laboratory) are listed below for reference.   Consultations:   Procedures/Studies:  ECHOCARDIOGRAM COMPLETE  Result Date: 12/29/2020    ECHOCARDIOGRAM REPORT   Patient Name:   Eric Allison Date of Exam: 12/29/2020 Medical Rec #:  [id number]       Height:       71.0 in Accession #:    [id number]      Weight:       150.0 lb Date of Birth:  1961-03-20       BSA:          1.866 m Patient Age:    60 years        BP:           110/72 mmHg Patient Gender: M               HR:           73 bpm. Exam Location:  [facility] Procedure: 2D Echo, Color Doppler and Cardiac Doppler  Indications:     R55 Syncope  History:         Patient has no prior history of Echocardiogram examinations.                  Risk Factors:Current Smoker.  Sonographer:     Charmayne Sheer RDCS (AE) Referring Phys:  [id number] Collier Bullock Diagnosing Phys: Serafina Royals MD  Sonographer Comments: Technically challenging study due to limited acoustic windows. Image acquisition challenging due to patient body habitus. IMPRESSIONS  1. Left ventricular ejection fraction, by estimation, is 65 to 70%. The left ventricle has normal function. The left ventricle has no regional wall motion abnormalities. Left ventricular diastolic parameters were normal.  2. Right ventricular systolic function is normal. The right ventricular size is normal.  3. The mitral valve is normal in structure. Trivial mitral valve regurgitation.  4. The aortic valve is normal in structure. Aortic valve regurgitation is trivial. FINDINGS  Left Ventricle: Left ventricular ejection fraction, by estimation, is 65 to 70%. The left ventricle has normal function. The left ventricle has no regional wall motion abnormalities. The left ventricular internal cavity size was small. There is no left ventricular hypertrophy. Left ventricular diastolic parameters were normal. Right Ventricle: The right ventricular size is normal. No increase in right ventricular wall thickness. Right ventricular systolic function is normal. Left Atrium: Left atrial size was normal in size. Right Atrium: Right atrial size was normal in size. Pericardium: There is no evidence of pericardial effusion. Mitral Valve: The mitral valve is normal in structure. Trivial mitral valve regurgitation. MV peak gradient, 1.6 mmHg. The mean mitral valve gradient is 1.0 mmHg. Tricuspid Valve: The tricuspid valve is normal in structure. Tricuspid valve regurgitation is trivial. Aortic Valve: The aortic valve is normal in structure. Aortic valve regurgitation is trivial. Aortic valve mean gradient measures  3.0 mmHg. Aortic valve peak gradient measures 4.8 mmHg. Aortic valve area, by VTI measures 4.61 cm. Pulmonic Valve: The pulmonic valve was normal in structure. Pulmonic valve regurgitation is not visualized. Aorta: The aortic root and ascending aorta are structurally normal, with no evidence of dilitation. IAS/Shunts: No atrial level shunt detected by color flow Doppler.  LEFT VENTRICLE PLAX 2D LVIDd:         3.80 cm  Diastology LVIDs:         2.50 cm  LV e' medial:    8.16 cm/s LV PW:         1.20 cm  LV E/e' medial:  7.8 LV IVS:        1.00 cm  LV e' lateral:  9.25 cm/s LVOT diam:     2.90 cm  LV E/e' lateral: 6.9 LV SV:         96 LV SV Index:   52 LVOT Area:     6.61 cm  LEFT ATRIUM         Index LA diam:    2.90 cm 1.55 cm/m  AORTIC VALVE                   PULMONIC VALVE AV Area (Vmax):    4.90 cm    PV Vmax:       1.09 m/s AV Area (Vmean):   5.14 cm    PV Vmean:      73.200 cm/s AV Area (VTI):     4.61 cm    PV VTI:        0.189 m AV Vmax:           110.00 cm/s PV Peak grad:  4.8 mmHg AV Vmean:          77.500 cm/s PV Mean grad:  3.0 mmHg AV VTI:            0.209 m AV Peak Grad:      4.8 mmHg AV Mean Grad:      3.0 mmHg LVOT Vmax:         81.60 cm/s LVOT Vmean:        60.300 cm/s LVOT VTI:          0.146 m LVOT/AV VTI ratio: 0.70  AORTA Ao Root diam: 3.35 cm MITRAL VALVE MV Area (PHT): 7.29 cm    SHUNTS MV Area VTI:   5.13 cm    Systemic VTI:  0.15 m MV Peak grad:  1.6 mmHg    Systemic Diam: 2.90 cm MV Mean grad:  1.0 mmHg MV Vmax:       0.64 m/s MV Vmean:      41.2 cm/s MV Decel Time: 104 msec MV E velocity: 63.80 cm/s MV A velocity: 80.10 cm/s MV E/A ratio:  0.80 Serafina Royals MD Electronically signed by Serafina Royals MD Signature Date/Time: 12/29/2020/1:09:29 PM    Final    US Abdomen Limited RUQ (LIVER/GB)  Result Date: 12/29/2020 CLINICAL DATA:  Transaminitis EXAM: ULTRASOUND ABDOMEN LIMITED RIGHT UPPER QUADRANT COMPARISON:  None. FINDINGS: Gallbladder: No gallstones or wall thickening  visualized. No sonographic Murphy sign noted by sonographer. Common bile duct: Diameter: 2.4 mm. Liver: There are 2 simple cyst within the right hepatic lobe both of which measure up to 1.2 cm in diameter. No solid hepatic lesion identified. Within normal limits in parenchymal echogenicity. Portal vein is patent on color Doppler imaging with normal direction of blood flow towards the liver. Other: None. IMPRESSION: 1. Essentially unremarkable ultrasound of the right upper quadrant. 2. Incidental note of two small simple cysts within the right hepatic lobe. Electronically Signed   By: Davina Poke D.O.   On: 12/29/2020 10:20      Labs: BNP (last 3 results) No results for input(s): BNP in the last 8760 hours. Basic Metabolic Panel: Recent Labs  Lab 12/29/20 0418 12/29/20 0956 12/30/20 0507  NA 136  --  138  K 3.7  --  3.8  CL 106  --  109  CO2 23  --  23  GLUCOSE 213*  --  95  BUN 17  --  9  CREATININE 1.16  --  0.77  CALCIUM 8.2*  --  8.0*  MG  --  1.9  --  Liver Function Tests: Recent Labs  Lab 12/29/20 0418  AST 284*  ALT 262*  ALKPHOS 57  BILITOT 0.4  PROT 6.2*  ALBUMIN 3.5   No results for input(s): LIPASE, AMYLASE in the last 168 hours. No results for input(s): AMMONIA in the last 168 hours. CBC: Recent Labs  Lab 12/29/20 0418 12/30/20 0507  WBC 15.0* 8.7  HGB 13.1 12.8*  HCT 39.2 37.3*  MCV 93.1 91.9  PLT 211 180   Cardiac Enzymes: No results for input(s): CKTOTAL, CKMB, CKMBINDEX, TROPONINI in the last 168 hours. BNP: Invalid input(s): POCBNP CBG: Recent Labs  Lab 12/29/20 1221 12/29/20 1615 12/29/20 2240 12/30/20 0716  GLUCAP 81 97 97 100*   D-Dimer No results for input(s): DDIMER in the last 72 hours. Hgb A1c Recent Labs    12/29/20 0418  HGBA1C 5.8*   Lipid Profile No results for input(s): CHOL, HDL, LDLCALC, TRIG, CHOLHDL, LDLDIRECT in the last 72 hours. Thyroid function studies No results for input(s): TSH, T4TOTAL, T3FREE,  THYROIDAB in the last 72 hours.  Invalid input(s): FREET3 Anemia work up No results for input(s): VITAMINB12, FOLATE, FERRITIN, TIBC, IRON, RETICCTPCT in the last 72 hours. Urinalysis No results found for: COLORURINE, APPEARANCEUR, [place], [place], GLUCOSEU, [place], [place], [place], PROTEINUR, UROBILINOGEN, NITRITE, LEUKOCYTESUR Sepsis Labs Invalid input(s): PROCALCITONIN,  WBC,  LACTICIDVEN Microbiology Recent Results (from the past 240 hour(s))  Resp Panel by RT-PCR (Flu A&B, Covid) Nasopharyngeal Swab     Status: None   Collection Time: 12/29/20  5:19 AM   Specimen: Nasopharyngeal Swab; Nasopharyngeal(NP) swabs in vial transport medium  Result Value Ref Range Status   SARS Coronavirus 2 by RT PCR NEGATIVE NEGATIVE Final    Comment: (NOTE) SARS-CoV-2 target nucleic acids are NOT DETECTED.  The SARS-CoV-2 RNA is generally detectable in upper respiratory specimens during the acute phase of infection. The lowest concentration of SARS-CoV-2 viral copies this assay can detect is 138 copies/mL. A negative result does not preclude SARS-Cov-2 infection and should not be used as the sole basis for treatment or other patient management decisions. A negative result may occur with  improper specimen collection/handling, submission of specimen other than nasopharyngeal swab, presence of viral mutation(s) within the areas targeted by this assay, and inadequate number of viral copies(<138 copies/mL). A negative result must be combined with clinical observations, patient history, and epidemiological information. The expected result is Negative.  Fact Sheet for Patients:  EntrepreneurPulse.com.au  Fact Sheet for Healthcare Providers:  IncredibleEmployment.be  This test is no t yet approved or cleared by the [place] FDA and  has been authorized for detection and/or diagnosis of SARS-CoV-2 by FDA under an Emergency Use Authorization (EUA). This  EUA will remain  in effect (meaning this test can be used) for the duration of the COVID-19 declaration under Section 564(b)(1) of the Act, 21 U.S.C.section 360bbb-3(b)(1), unless the authorization is terminated  or revoked sooner.       Influenza A by PCR NEGATIVE NEGATIVE Final   Influenza B by PCR NEGATIVE NEGATIVE Final    Comment: (NOTE) The Xpert Xpress SARS-CoV-2/FLU/RSV plus assay is intended as an aid in the diagnosis of influenza from Nasopharyngeal swab specimens and should not be used as a sole basis for treatment. Nasal washings and aspirates are unacceptable for Xpert Xpress SARS-CoV-2/FLU/RSV testing.  Fact Sheet for Patients: EntrepreneurPulse.com.au  Fact Sheet for Healthcare Providers: IncredibleEmployment.be  This test is not yet approved or cleared by the [place] FDA and has been authorized for detection and/or  diagnosis of SARS-CoV-2 by FDA under an Emergency Use Authorization (EUA). This EUA will remain in effect (meaning this test can be used) for the duration of the COVID-19 declaration under Section 564(b)(1) of the Act, 21 U.S.C. section 360bbb-3(b)(1), unless the authorization is terminated or revoked.  Performed at [facility], [address] [place], [place] [address]      Total time spend on discharging this patient, including the last patient exam, discussing the hospital stay, instructions for ongoing care as it relates to all pertinent caregivers, as well as preparing the medical discharge records, prescriptions, and/or referrals as applicable, is 45 minutes.    Enzo Bi, MD  [facility] 12/30/2020, 10:07 AM

## 2021-05-06 ENCOUNTER — Emergency Department
Admission: EM | Admit: 2021-05-06 | Discharge: 2021-05-06 | Disposition: A | Discharge status: Home / Self Care | Payer: PRIVATE HEALTH INSURANCE | Attending: Emergency Medicine | Admitting: Emergency Medicine

## 2021-05-06 ENCOUNTER — Other Ambulatory Visit: Payer: Self-pay

## 2021-05-06 ENCOUNTER — Emergency Department: Payer: PRIVATE HEALTH INSURANCE

## 2021-05-06 ENCOUNTER — Encounter: Payer: Self-pay | Admitting: Emergency Medicine

## 2021-05-06 DIAGNOSIS — F17210 Nicotine dependence, cigarettes, uncomplicated: Secondary | ICD-10-CM | POA: Diagnosis not present

## 2021-05-06 DIAGNOSIS — Z7982 Long term (current) use of aspirin: Secondary | ICD-10-CM | POA: Insufficient documentation

## 2021-05-06 DIAGNOSIS — I480 Paroxysmal atrial fibrillation: Secondary | ICD-10-CM | POA: Diagnosis not present

## 2021-05-06 DIAGNOSIS — Z20822 Contact with and (suspected) exposure to covid-19: Secondary | ICD-10-CM | POA: Diagnosis not present

## 2021-05-06 DIAGNOSIS — R079 Chest pain, unspecified: Secondary | ICD-10-CM

## 2021-05-06 LAB — TROPONIN I (HIGH SENSITIVITY)
Troponin I (High Sensitivity): 30 ng/L — ABNORMAL HIGH (ref <18)
Troponin I (High Sensitivity): 34 ng/L — ABNORMAL HIGH (ref <18)

## 2021-05-06 LAB — CBC
HCT: 45.2 % (ref 39.0–52.0)
Hemoglobin: 15.6 g/dL (ref 13.0–17.0)
MCH: 31.1 pg (ref 26.0–34.0)
MCHC: 34.5 g/dL (ref 30.0–36.0)
MCV: 90.2 fL (ref 80.0–100.0)
Platelets: 256 10*3/uL (ref 150–400)
RBC: 5.01 MIL/uL (ref 4.22–5.81)
RDW: 13.3 % (ref 11.5–15.5)
WBC: 11.5 10*3/uL — ABNORMAL HIGH (ref 4.0–10.5)
nRBC: 0 % (ref 0.0–0.2)

## 2021-05-06 LAB — RESP PANEL BY RT-PCR (FLU A&B, COVID) ARPGX2
Influenza A by PCR: NEGATIVE
Influenza B by PCR: NEGATIVE
SARS Coronavirus 2 by RT PCR: NEGATIVE

## 2021-05-06 LAB — BASIC METABOLIC PANEL
Anion gap: 9 (ref 5–15)
BUN: 10 mg/dL (ref 6–20)
CO2: 25 mmol/L (ref 22–32)
Calcium: 9.1 mg/dL (ref 8.9–10.3)
Chloride: 103 mmol/L (ref 98–111)
Creatinine, Ser: 0.99 mg/dL (ref 0.61–1.24)
GFR, Estimated: >60 mL/min (ref >60)
Glucose, Bld: 157 mg/dL — ABNORMAL HIGH (ref 70–99)
Potassium: 3.6 mmol/L (ref 3.5–5.1)
Sodium: 137 mmol/L (ref 135–145)

## 2021-05-06 LAB — MAGNESIUM: Magnesium: 1.9 mg/dL (ref 1.7–2.4)

## 2021-05-06 LAB — TSH: TSH: 0.777 u[IU]/mL (ref 0.350–4.500)

## 2021-05-06 MED ORDER — METOPROLOL TARTRATE 25 MG PO TABS
25.0000 mg | ORAL_TABLET | Freq: Once | ORAL | Status: DC
  Filled 2021-05-06: qty 1

## 2021-05-06 MED ORDER — METOPROLOL TARTRATE 25 MG PO TABS: 12.5000 mg | ORAL_TABLET | Freq: Two times a day (BID) | ORAL | 1 refills | Status: AC

## 2021-05-06 MED ORDER — METOPROLOL TARTRATE 5 MG/5ML IV SOLN
5.0000 mg | INTRAVENOUS | Status: DC | PRN
  Administered 2021-05-06: 5 mg via INTRAVENOUS
  Filled 2021-05-06: qty 5

## 2021-05-06 MED ORDER — METOPROLOL TARTRATE 25 MG PO TABS
12.5000 mg | ORAL_TABLET | Freq: Once | ORAL | Status: AC
  Administered 2021-05-06: 12.5 mg via ORAL

## 2021-05-06 NOTE — ED Provider Notes (Signed)
[facility] Emergency Department Provider Note   ____________________________________________   Event Date/Time   First MD Initiated Contact with Patient 05/06/21 1156     (approximate)  I have reviewed the triage vital signs and the nursing notes.   HISTORY  Chief Complaint Chest Pain and Shortness of Breath    HPI Eric Allison is a 60 y.o. male with no significant past medical history presents to the ED complaining of chest pain.  Patient reports that for about the past 1 to 2 hours he has been dealing with dull aching pain in the center of his chest associated with some mild difficulty breathing.  Pain seems to wax and wane in severity, but is not exacerbated or alleviated by anything in particular.  When the pain is not as severe, he does feel like his heart is racing and fluttering.  He states he has been dealing with a cough for the past week, but denies any fevers and has otherwise been feeling well prior to onset of symptoms.  He has never had similar symptoms in the past, does report being hospitalized this summer for syncopal episodes associated with elevated troponin.  He does not take any blood thinners.        History reviewed. No pertinent past medical history.  Patient Active Problem List   Diagnosis Date Noted   Syncope 12/29/2020   Nicotine dependence 12/29/2020   Elevated troponin 12/29/2020   Leukemoid reaction 12/29/2020   Hyperglycemia 12/29/2020    History reviewed. No pertinent surgical history.  Prior to Admission medications   Medication Sig Start Date End Date Taking? Authorizing Provider  metoprolol tartrate (LOPRESSOR) 25 MG tablet Take 0.5 tablets (12.5 mg total) by mouth 2 (two) times daily. 05/06/21 07/05/21 Yes Blake Divine, MD  aspirin EC 81 MG EC tablet Take 1 tablet (81 mg total) by mouth daily. Swallow whole. 12/31/20   Enzo Bi, MD  atorvastatin (LIPITOR) 40 MG tablet Take 1 tablet (40 mg total) by mouth  daily. 12/31/20 03/31/21  Enzo Bi, MD    Allergies Patient has no allergy information on record.  Family History  Problem Relation Age of Onset   Hypertension Mother     Social History Social History   Tobacco Use   Smoking status: Every Day    Packs/day: 1.00    Types: Cigarettes  Substance Use Topics   Alcohol use: Yes    Comment: occ   Drug use: Never    Review of Systems  Constitutional: No fever/chills Eyes: No visual changes. ENT: No sore throat. Cardiovascular: Positive for palpitations and chest pain. Respiratory: Positive for cough and shortness of breath. Gastrointestinal: No abdominal pain.  No nausea, no vomiting.  No diarrhea.  No constipation. Genitourinary: Negative for dysuria. Musculoskeletal: Negative for back pain. Skin: Negative for rash. Neurological: Negative for headaches, focal weakness or numbness.  ____________________________________________   PHYSICAL EXAM:  VITAL SIGNS: ED Triage Vitals  Enc Vitals Group     BP 05/06/21 1153 132/87     Pulse Rate 05/06/21 1153 (!) 102     Resp 05/06/21 1153 20     Temp 05/06/21 1153 98.3 F (36.8 C)     Temp Source 05/06/21 1153 Oral     SpO2 05/06/21 1153 96 %     Weight 05/06/21 1144 150 lb (68 kg)     Height 05/06/21 1144 5\' 11"  (1.803 m)     Head Circumference --      Peak Flow --  Pain Score 05/06/21 1144 7     Pain Loc --      Pain Edu? --      Excl. in [place]? --     Constitutional: Alert and oriented. Eyes: Conjunctivae are normal. Head: Atraumatic. Nose: No congestion/rhinnorhea. Mouth/Throat: Mucous membranes are moist. Neck: Normal ROM Cardiovascular: Tachycardic, irregularly irregular rhythm. Grossly normal heart sounds.  2+ radial pulses bilaterally. Respiratory: Normal respiratory effort.  No retractions. Lungs CTAB. Gastrointestinal: Soft and nontender. No distention. Genitourinary: deferred Musculoskeletal: No lower extremity tenderness nor edema. Neurologic:  Normal  speech and language. No gross focal neurologic deficits are appreciated. Skin:  Skin is warm, dry and intact. No rash noted. Psychiatric: Mood and affect are normal. Speech and behavior are normal.  ____________________________________________   LABS (all labs ordered are listed, but only abnormal results are displayed)  Labs Reviewed  BASIC METABOLIC PANEL - Abnormal; Notable for the following components:      Result Value   Glucose, Bld 157 (*)    All other components within normal limits  CBC - Abnormal; Notable for the following components:   WBC 11.5 (*)    All other components within normal limits  TROPONIN I (HIGH SENSITIVITY) - Abnormal; Notable for the following components:   Troponin I (High Sensitivity) 30 (*)    All other components within normal limits  TROPONIN I (HIGH SENSITIVITY) - Abnormal; Notable for the following components:   Troponin I (High Sensitivity) 34 (*)    All other components within normal limits  RESP PANEL BY RT-PCR (FLU A&B, COVID) ARPGX2  MAGNESIUM  TSH   ____________________________________________  EKG  ED ECG REPORT I, Blake Divine, the attending physician, personally viewed and interpreted this ECG.   Date: 05/06/2021  EKG Time: 11:42  Rate: 106  Rhythm: atrial fibrillation  Axis: Normal  Intervals:right bundle branch block and left posterior fascicular block  ST&T Change: None  ED ECG REPORT I, Blake Divine, the attending physician, personally viewed and interpreted this ECG.   Date: 05/06/2021  EKG Time: 12:49  Rate: 58  Rhythm: sinus bradycardia  Axis: Normal  Intervals:right bundle branch block and left posterior fascicular block  ST&T Change: Nonspecific T wave changes    PROCEDURES  Procedure(s) performed (including Critical Care):  Procedures   ____________________________________________   INITIAL IMPRESSION / ASSESSMENT AND PLAN / ED COURSE      60 year old male with no significant past medical  history presents to the ED complaining of chest pain, palpitations, and mild difficulty breathing for the past 1 to 2 hours.  Patient is not in any respiratory distress, maintaining O2 sats on room air.  However, he is tachycardic and noted to have irregular rhythm.  EKG shows wide-complex irregular tachycardia with complexes appearing very similar to his prior EKG, where he was noted to have a bifascicular block.  He now appears to have developed atrial fibrillation with aberrancy, EKG reviewed with Dr. Nehemiah Massed of cardiology, who agrees that is consistent with atrial fibrillation and not a ventricular tachycardia.  He agrees with plan to control rate with IV metoprolol and to start patient on heparin.  Labs and chest x-ray are pending at this time.  Chest x-ray reviewed by me and shows bullous changes related to emphysema but no focal infiltrate, edema, or effusion.  Patient converted to normal sinus rhythm following dose of IV metoprolol, now states he feels much better with resolution of any chest pain or shortness of breath.  Labs are remarkable for mildly elevated  troponin, likely due to his run of atrial fibrillation.  Case discussed with Dr. Nehemiah Massed of cardiology, who agrees that patient will be appropriate for discharge home with close follow-up in 1 to 2 days if repeat troponin is stable.  Patient was given small oral dose of metoprolol for ongoing rate control.  Repeat troponin without significant change from initial and patient remains asymptomatic at this time.  He is appropriate for discharge home with cardiology follow-up, was counseled to return to the ED for new worsening symptoms.  We will hold off on anticoagulation per cardiology given his CHA2DS2-VASc score of 0.      ____________________________________________   FINAL CLINICAL IMPRESSION(S) / ED DIAGNOSES  Final diagnoses:  Paroxysmal atrial fibrillation (HCC)  Chest pain, unspecified type     ED Discharge Orders           Ordered    metoprolol tartrate (LOPRESSOR) 25 MG tablet  2 times daily        05/06/21 1518             Note:  This document was prepared using Dragon voice recognition software and may include unintentional dictation errors.    Blake Divine, MD 05/06/21 1520

## 2021-05-06 NOTE — ED Triage Notes (Signed)
Pt reports can't explain it really but has a feeling in his chest that just doesnt feel right. Pt also reports some sharp pain in his chest as well and some SOB.

## 2021-07-03 ENCOUNTER — Other Ambulatory Visit: Payer: Self-pay | Admitting: *Deleted

## 2021-07-03 DIAGNOSIS — Z87891 Personal history of nicotine dependence: Secondary | ICD-10-CM

## 2021-07-03 DIAGNOSIS — F17210 Nicotine dependence, cigarettes, uncomplicated: Secondary | ICD-10-CM

## 2021-07-25 ENCOUNTER — Ambulatory Visit (INDEPENDENT_AMBULATORY_CARE_PROVIDER_SITE_OTHER): Payer: PRIVATE HEALTH INSURANCE | Admitting: Acute Care

## 2021-07-25 ENCOUNTER — Other Ambulatory Visit: Payer: Self-pay

## 2021-07-25 ENCOUNTER — Encounter: Payer: Self-pay | Admitting: Acute Care

## 2021-07-25 ENCOUNTER — Ambulatory Visit
Admission: RE | Admit: 2021-07-25 | Discharge: 2021-07-25 | Disposition: A | Discharge status: Home / Self Care | Payer: PRIVATE HEALTH INSURANCE | Source: Ambulatory Visit | Attending: Acute Care | Admitting: Acute Care

## 2021-07-25 DIAGNOSIS — Z87891 Personal history of nicotine dependence: Secondary | ICD-10-CM | POA: Insufficient documentation

## 2021-07-25 DIAGNOSIS — F17210 Nicotine dependence, cigarettes, uncomplicated: Secondary | ICD-10-CM | POA: Insufficient documentation

## 2021-07-25 NOTE — Progress Notes (Addendum)
Virtual Visit via Telephone Note  I connected with Kyvon Hu on 08/01/21 at 11:30 AM EST by telephone and verified that I am speaking with the correct person using two identifiers.  Location: Patient:  At home Provider:  [place], [place], [place], [address]    I discussed the limitations, risks, security and privacy concerns of performing an evaluation and management service by telephone and the availability of in person appointments. I also discussed with the patient that there may be a patient responsible charge related to this service. The patient expressed understanding and agreed to proceed.   Shared Decision Making Visit Lung Cancer Screening Program [phone])   Eligibility: Age 61 y.o. Pack Years Smoking History Calculation 98 pack year smoking history (# packs/per year x # years smoked) Recent History of coughing up blood  no Unexplained weight loss? no ( >Than 15 pounds within the last 6 months ) Prior History Lung / other cancer no (Diagnosis within the last 5 years already requiring surveillance chest CT Scans). Smoking Status Current Smoker Former Smokers: Years since quit:  NA  Quit Date:  NA  Visit Components: Discussion included one or more decision making aids. yes Discussion included risk/benefits of screening. yes Discussion included potential follow up diagnostic testing for abnormal scans. yes Discussion included meaning and risk of over diagnosis. yes Discussion included meaning and risk of False Positives. yes Discussion included meaning of total radiation exposure. yes  Counseling Included: Importance of adherence to annual lung cancer LDCT screening. yes Impact of comorbidities on ability to participate in the program. yes Ability and willingness to under diagnostic treatment. yes  Smoking Cessation Counseling: Current Smokers:  Discussed importance of smoking cessation. yes Information about tobacco cessation classes and  interventions provided to patient. yes Patient provided with "ticket" for LDCT Scan. yes Symptomatic Patient. no  Counseling NA Diagnosis Code: Tobacco Use Z72.0 Asymptomatic Patient yes  Counseling (Intermediate counseling: > three minutes counseling) Q0347 Former Smokers:  Discussed the importance of maintaining cigarette abstinence. yes Diagnosis Code: Personal History of Nicotine Dependence. Q25.956 Information about tobacco cessation classes and interventions provided to patient. Yes Patient provided with "ticket" for LDCT Scan. yes Written Order for Lung Cancer Screening with LDCT placed in Epic. Yes (CT Chest Lung Cancer Screening Low Dose W/O CM) [id number] Z12.2-Screening of respiratory organs Z87.891-Personal history of nicotine dependence  I have spent 25 minutes of face to face/ virtual visit   time with  Mr. Loeza discussing the risks and benefits of lung cancer screening. We viewed / discussed a power point together that explained in detail the above noted topics. We paused at intervals to allow for questions to be asked and answered to ensure understanding.We discussed that the single most powerful action that he can take to decrease his risk of developing lung cancer is to quit smoking. We discussed whether or not he is ready to commit to setting a quit date. We discussed options for tools to aid in quitting smoking including nicotine replacement therapy, non-nicotine medications, support groups, Quit Smart classes, and behavior modification. We discussed that often times setting smaller, more achievable goals, such as eliminating 1 cigarette a day for a week and then 2 cigarettes a day for a week can be helpful in slowly decreasing the number of cigarettes smoked. This allows for a sense of accomplishment as well as providing a clinical benefit. I provided  him  with smoking cessation  information  with contact information for community resources, classes, free  nicotine replacement  therapy, and access to mobile apps, text messaging, and on-line smoking cessation help. I have also provided  him  the office contact information in the event he needs to contact me, or the screening staff. We discussed the time and location of the scan, and that either Doroteo Glassman RN, Joella Prince, RN  or I will call / send a letter with the results within 24-72 hours of receiving them. The patient verbalized understanding of all of  the above and had no further questions upon leaving the office. They have my contact information in the event they have any further questions.  I spent 3-4 minutes counseling on smoking cessation and the health risks of continued tobacco abuse.  I explained to the patient that there has been a high incidence of coronary artery disease noted on these exams. I explained that this is a non-gated exam therefore degree or severity cannot be determined. This patient is on statin therapy. I have asked the patient to follow-up with their PCP regarding any incidental finding of coronary artery disease and management with diet or medication as their PCP  feels is clinically indicated. The patient verbalized understanding of the above and had no further questions upon completion of the visit.    I spent 30 minutes dedicated to the care of this patient on the date of this encounter to include pre-visit review of records, non- face-to-face time with the patient discussing conditions above, post visit ordering of testing, clinical documentation with the electronic health record, making appropriate referrals as documented, and communicating necessary information to the patient's healthcare team.   Magdalen Spatz, NP 07/25/2021

## 2021-07-25 NOTE — Patient Instructions (Signed)
Thank you for participating in the [facility] Screening Program. °It was our pleasure to meet you today. °We will call you with the results of your scan within the next few days. °Your scan will be assigned a Lung RADS category score by the physicians reading the scans.  °This Lung RADS score determines follow up scanning.  °See below for description of categories, and follow up screening recommendations. °We will be in touch to schedule your follow up screening annually or based on recommendations of our providers. °We will fax a copy of your scan results to your Primary Care Physician, or the physician who referred you to the program, to ensure they have the results. °Please call the office if you have any questions or concerns regarding your scanning experience or results.  °Our office number is [phone]. °Please speak with Denise Phelps, RN. She is our Lung Cancer Screening RN. °If she is unavailable when you call, please have the office staff send her a message. She will return your call at her earliest convenience. °Remember, if your scan is normal, we will scan you annually as long as you continue to meet the criteria for the program. (Age 55-77, Current smoker or smoker who has quit within the last 15 years). °If you are a smoker, remember, quitting is the single most powerful action that you can take to decrease your risk of lung cancer and other pulmonary, breathing related problems. °We know quitting is hard, and we are here to help.  °Please let us know if there is anything we can do to help you meet your goal of quitting. °If you are a former smoker, congratulations. We are proud of you! Remain smoke free! °Remember you can refer friends or family members through the number above.  °We will screen them to make sure they meet criteria for the program. °Thank you for helping us take better care of you by participating in Lung Screening. ° °You can receive free nicotine replacement therapy  ( patches, gum or mints) by calling 1-800-QUIT NOW. Please call so we can get you on the path to becoming  a non-smoker. I know it is hard, but you can do this! ° °Lung RADS Categories: ° °Lung RADS 1: no nodules or definitely non-concerning nodules.  °Recommendation is for a repeat annual scan in 12 months. ° °Lung RADS 2:  nodules that are non-concerning in appearance and behavior with a very low likelihood of becoming an active cancer. °Recommendation is for a repeat annual scan in 12 months. ° °Lung RADS 3: nodules that are probably non-concerning , includes nodules with a low likelihood of becoming an active cancer.  Recommendation is for a 6-month repeat screening scan. Often noted after an upper respiratory illness. We will be in touch to make sure you have no questions, and to schedule your 6-month scan. ° °Lung RADS 4 A: nodules with concerning findings, recommendation is most often for a follow up scan in 3 months or additional testing based on our provider's assessment of the scan. We will be in touch to make sure you have no questions and to schedule the recommended 3 month follow up scan. ° °Lung RADS 4 B:  indicates findings that are concerning. We will be in touch with you to schedule additional diagnostic testing based on our provider's  assessment of the scan. ° °Hypnosis for smoking cessation  °[employer] °[phone] ° °Acupuncture for smoking cessation  °[facility] °[phone]  °

## 2021-08-22 ENCOUNTER — Other Ambulatory Visit: Payer: Self-pay | Admitting: Acute Care

## 2021-08-22 ENCOUNTER — Other Ambulatory Visit: Payer: Self-pay | Admitting: *Deleted

## 2021-08-22 DIAGNOSIS — F17210 Nicotine dependence, cigarettes, uncomplicated: Secondary | ICD-10-CM

## 2021-08-22 DIAGNOSIS — Z87891 Personal history of nicotine dependence: Secondary | ICD-10-CM

## 2021-10-02 ENCOUNTER — Emergency Department: Payer: 59

## 2021-10-02 ENCOUNTER — Inpatient Hospital Stay
Admission: EM | Admit: 2021-10-02 | Discharge: 2021-10-05 | DRG: 229 | Disposition: A | Discharge status: Home / Self Care | Payer: 59 | Attending: Internal Medicine | Admitting: Internal Medicine

## 2021-10-02 ENCOUNTER — Other Ambulatory Visit: Payer: Self-pay

## 2021-10-02 DIAGNOSIS — R001 Bradycardia, unspecified: Secondary | ICD-10-CM | POA: Diagnosis present

## 2021-10-02 DIAGNOSIS — Z8249 Family history of ischemic heart disease and other diseases of the circulatory system: Secondary | ICD-10-CM

## 2021-10-02 DIAGNOSIS — I4510 Unspecified right bundle-branch block: Secondary | ICD-10-CM

## 2021-10-02 DIAGNOSIS — I442 Atrioventricular block, complete: Principal | ICD-10-CM | POA: Diagnosis present

## 2021-10-02 DIAGNOSIS — Z79899 Other long term (current) drug therapy: Secondary | ICD-10-CM

## 2021-10-02 DIAGNOSIS — I445 Left posterior fascicular block: Secondary | ICD-10-CM

## 2021-10-02 DIAGNOSIS — Z006 Encounter for examination for normal comparison and control in clinical research program: Secondary | ICD-10-CM

## 2021-10-02 DIAGNOSIS — I959 Hypotension, unspecified: Secondary | ICD-10-CM | POA: Diagnosis present

## 2021-10-02 DIAGNOSIS — R197 Diarrhea, unspecified: Secondary | ICD-10-CM | POA: Diagnosis present

## 2021-10-02 DIAGNOSIS — R55 Syncope and collapse: Secondary | ICD-10-CM | POA: Diagnosis not present

## 2021-10-02 DIAGNOSIS — R778 Other specified abnormalities of plasma proteins: Secondary | ICD-10-CM | POA: Diagnosis present

## 2021-10-02 DIAGNOSIS — I10 Essential (primary) hypertension: Secondary | ICD-10-CM | POA: Diagnosis present

## 2021-10-02 DIAGNOSIS — D72823 Leukemoid reaction: Secondary | ICD-10-CM | POA: Diagnosis present

## 2021-10-02 DIAGNOSIS — Z20822 Contact with and (suspected) exposure to covid-19: Secondary | ICD-10-CM | POA: Diagnosis present

## 2021-10-02 DIAGNOSIS — I248 Other forms of acute ischemic heart disease: Secondary | ICD-10-CM | POA: Diagnosis present

## 2021-10-02 DIAGNOSIS — Z7982 Long term (current) use of aspirin: Secondary | ICD-10-CM

## 2021-10-02 DIAGNOSIS — F17210 Nicotine dependence, cigarettes, uncomplicated: Secondary | ICD-10-CM | POA: Diagnosis present

## 2021-10-02 DIAGNOSIS — E162 Hypoglycemia, unspecified: Secondary | ICD-10-CM | POA: Diagnosis not present

## 2021-10-02 DIAGNOSIS — F17200 Nicotine dependence, unspecified, uncomplicated: Secondary | ICD-10-CM | POA: Diagnosis present

## 2021-10-02 LAB — URINALYSIS, ROUTINE W REFLEX MICROSCOPIC
Bacteria, UA: NONE SEEN
Bilirubin Urine: NEGATIVE
Glucose, UA: NEGATIVE mg/dL
Ketones, ur: NEGATIVE mg/dL
Leukocytes,Ua: NEGATIVE
Nitrite: NEGATIVE
Protein, ur: 30 mg/dL — AB
Specific Gravity, Urine: 1.005 (ref 1.005–1.030)
Squamous Epithelial / HPF: NONE SEEN (ref 0–5)
pH: 6 (ref 5.0–8.0)

## 2021-10-02 LAB — CBC
HCT: 43.9 % (ref 39.0–52.0)
Hemoglobin: 14.3 g/dL (ref 13.0–17.0)
MCH: 29.5 pg (ref 26.0–34.0)
MCHC: 32.6 g/dL (ref 30.0–36.0)
MCV: 90.7 fL (ref 80.0–100.0)
Platelets: 231 10*3/uL (ref 150–400)
RBC: 4.84 MIL/uL (ref 4.22–5.81)
RDW: 13.3 % (ref 11.5–15.5)
WBC: 11 10*3/uL — ABNORMAL HIGH (ref 4.0–10.5)
nRBC: 0 % (ref 0.0–0.2)

## 2021-10-02 LAB — BASIC METABOLIC PANEL
Anion gap: 10 (ref 5–15)
BUN: 12 mg/dL (ref 6–20)
CO2: 24 mmol/L (ref 22–32)
Calcium: 8.9 mg/dL (ref 8.9–10.3)
Chloride: 101 mmol/L (ref 98–111)
Creatinine, Ser: 1.03 mg/dL (ref 0.61–1.24)
GFR, Estimated: >60 mL/min (ref >60)
Glucose, Bld: 158 mg/dL — ABNORMAL HIGH (ref 70–99)
Potassium: 3.7 mmol/L (ref 3.5–5.1)
Sodium: 135 mmol/L (ref 135–145)

## 2021-10-02 LAB — TSH: TSH: 1.665 u[IU]/mL (ref 0.350–4.500)

## 2021-10-02 LAB — TROPONIN I (HIGH SENSITIVITY)
Troponin I (High Sensitivity): 19 ng/L — ABNORMAL HIGH (ref <18)
Troponin I (High Sensitivity): 76 ng/L — ABNORMAL HIGH (ref <18)
Troponin I (High Sensitivity): 93 ng/L — ABNORMAL HIGH (ref <18)
Troponin I (High Sensitivity): 88 ng/L — ABNORMAL HIGH (ref <18)

## 2021-10-02 MED ORDER — SODIUM CHLORIDE 0.9 % IV BOLUS
1000.0000 mL | Freq: Once | INTRAVENOUS | Status: AC
  Administered 2021-10-02: 1000 mL via INTRAVENOUS

## 2021-10-02 MED ORDER — SODIUM CHLORIDE 0.9% FLUSH: 3.0000 mL | Freq: Two times a day (BID) | INTRAVENOUS | Status: DC

## 2021-10-02 MED ORDER — SODIUM CHLORIDE 0.9% FLUSH: 3.0000 mL | INTRAVENOUS | Status: DC | PRN

## 2021-10-02 MED ORDER — SODIUM CHLORIDE 0.9 % IV SOLN: 250.0000 mL | INTRAVENOUS | Status: DC | PRN

## 2021-10-02 MED ORDER — ACETAMINOPHEN 650 MG RE SUPP: 650.0000 mg | Freq: Four times a day (QID) | RECTAL | Status: DC | PRN

## 2021-10-02 MED ORDER — ASPIRIN 81 MG PO CHEW
324.0000 mg | CHEWABLE_TABLET | Freq: Once | ORAL | Status: AC
  Administered 2021-10-02: 324 mg via ORAL
  Filled 2021-10-02: qty 4

## 2021-10-02 MED ORDER — SODIUM CHLORIDE 0.9% FLUSH
3.0000 mL | Freq: Two times a day (BID) | INTRAVENOUS | Status: DC
  Administered 2021-10-03 – 2021-10-04 (×2): 3 mL via INTRAVENOUS

## 2021-10-02 MED ORDER — SODIUM CHLORIDE 0.9 % IV SOLN: INTRAVENOUS | Status: DC

## 2021-10-02 MED ORDER — ACETAMINOPHEN 325 MG PO TABS: 650.0000 mg | ORAL_TABLET | Freq: Four times a day (QID) | ORAL | Status: DC | PRN

## 2021-10-02 MED ORDER — ROSUVASTATIN CALCIUM 10 MG PO TABS
10.0000 mg | ORAL_TABLET | Freq: Every day | ORAL | Status: DC
  Administered 2021-10-04 – 2021-10-05 (×2): 10 mg via ORAL
  Filled 2021-10-02 (×3): qty 1

## 2021-10-02 MED ORDER — ONDANSETRON HCL 4 MG PO TABS: 4.0000 mg | ORAL_TABLET | Freq: Four times a day (QID) | ORAL | Status: DC | PRN

## 2021-10-02 MED ORDER — ASPIRIN 81 MG PO CHEW
324.0000 mg | CHEWABLE_TABLET | ORAL | Status: AC
  Administered 2021-10-03: 324 mg via ORAL
  Filled 2021-10-02: qty 4

## 2021-10-02 MED ORDER — SODIUM CHLORIDE 0.9 % IV BOLUS
500.0000 mL | Freq: Once | INTRAVENOUS | Status: AC
  Administered 2021-10-02: 500 mL via INTRAVENOUS

## 2021-10-02 MED ORDER — ONDANSETRON HCL 4 MG/2ML IJ SOLN: 4.0000 mg | Freq: Four times a day (QID) | INTRAMUSCULAR | Status: DC | PRN

## 2021-10-02 MED ORDER — ASPIRIN EC 81 MG PO TBEC
81.0000 mg | DELAYED_RELEASE_TABLET | Freq: Every day | ORAL | Status: DC
  Administered 2021-10-04 – 2021-10-05 (×2): 81 mg via ORAL
  Filled 2021-10-02 (×2): qty 1

## 2021-10-02 NOTE — ED Notes (Signed)
Requested urine sample; states he will attempt. Given urinal. ?

## 2021-10-02 NOTE — ED Notes (Signed)
Green, Purple, and blue top sent  ?

## 2021-10-02 NOTE — Consult Note (Signed)
[facility] Cardiology ? ?CARDIOLOGY CONSULT NOTE  ?Patient ID: ?Eric Allison ?MRN: [id number] ?DOB/AGE: [birth year] 61 y.o. ? ?Admit date: 10/02/2021 ?Referring Physician Agbata ?Primary Physician Ellene Route ?Primary Cardiologist Nehemiah Massed ?Reason for Consultation Syncope, AV dissociation ? ?HPI:  ?Eric Allison is a 61 year old male with history of tobacco dependence, hypertension, chronic bifascicular block who presents to the emergency department with 2 near syncopal episodes. ? ?He woke up this morning feeling dizzy and lightheaded, and felt as though he was going to pass out so he was able to lower himself to the ground.  He is unsure how long he was on the ground and whether he completely lost consciousness (though he thinks he did), but while sitting in his truck he had another episode which prompted him to call EMS.  He denies any chest pain, shortness of breath, palpitations surrounding the time of the event, or in his past. Notably he had similar episodes in July 2022 which she was observed in the hospital.  Following this he eventually followed up with Dr. Nehemiah Massed who completed an echocardiogram which was unremarkable, and a stress test which was normal. ? ?On arrival to the emergency department his heart rate is in the 60s, and his blood pressure is low normal.  He saturating well on room air.  His EKG shows a wide right bundle branch block with a left posterior fascicular block; unclear if the P waves are associated with the QRS complex.  His troponin is elevated to 78 on repeat. ? ?Review of systems complete and found to be negative unless listed above  ? ? ? ?History reviewed. No pertinent past medical history.  ?History reviewed. No pertinent surgical history.  ?(Not in a hospital admission) ? ?Social History  ? ?Socioeconomic History  ? Marital status: Divorced  ?  Spouse name: Not on file  ? Number of children: Not on file  ? Years of education: Not on file  ? Highest education level: Not on  file  ?Occupational History  ? Not on file  ?Tobacco Use  ? Smoking status: Every Day  ?  Packs/day: 2.00  ?  Years: 49.00  ?  Pack years: 98.00  ?  Types: Cigarettes  ? Smokeless tobacco: Never  ?Substance and Sexual Activity  ? Alcohol use: Yes  ?  Comment: occ  ? Drug use: Never  ? Sexual activity: Not on file  ?Other Topics Concern  ? Not on file  ?Social History Narrative  ? Not on file  ? ?Social Determinants of Health  ? ?Financial Resource Strain: Not on file  ?Food Insecurity: Not on file  ?Transportation Needs: Not on file  ?Physical Activity: Not on file  ?Stress: Not on file  ?Social Connections: Not on file  ?Intimate Partner Violence: Not on file  ?  ?Family History  ?Problem Relation Age of Onset  ? Hypertension Mother   ?  ? ? ?Review of systems complete and found to be negative unless listed above  ? ? ? ? ?PHYSICAL EXAM ?Vitals:  ? 10/02/21 1400 10/02/21 1500  ?BP: (!) 95/56 107/70  ?Pulse: 62 65  ?Resp: 16 16  ?Temp:    ?SpO2: 95% 96%  ? ? ? ?General: Well developed, well nourished, in no acute distress ?HEENT:  Normocephalic and atramatic ?Neck:  No JVD.  ?Lungs: Clear bilaterally to auscultation and percussion. Decrease breath sounds ?Heart: Distant heart sounds. HRRR . Normal S1 and S2 without gallops or murmurs.  ?Abdomen: Bowel sounds are positive, abdomen soft  and non-tender  ?Msk:  Back normal, normal gait. Normal strength and tone for age. ?Extremities: No clubbing, cyanosis or edema.   ?Neuro: Alert and oriented X 3. ?Psych:  Good affect, responds appropriately ? ?Labs: ?  ?Lab Results  ?Component Value Date  ? WBC 11.0 (H) 10/02/2021  ? HGB 14.3 10/02/2021  ? HCT 43.9 10/02/2021  ? MCV 90.7 10/02/2021  ? PLT 231 10/02/2021  ?  ?Recent Labs  ?Lab 10/02/21 ?1027  ?NA 135  ?K 3.7  ?CL 101  ?CO2 24  ?BUN 12  ?CREATININE 1.03  ?CALCIUM 8.9  ?GLUCOSE 158*  ? ?Lab Results  ?Component Value Date  ? CKTOTAL 147 12/30/2020  ? No results found for: CHOL ?No results found for: HDL ?No results found  for: [place] ?No results found for: TRIG ?No results found for: CHOLHDL ?No results found for: LDLDIRECT  ?  ?Radiology: DG Chest Portable 1 View ? ?Result Date: 10/02/2021 ?CLINICAL DATA:  Chest pain and dizziness EXAM: PORTABLE CHEST 1 VIEW COMPARISON:  05/06/2021 FINDINGS: Heart size is normal. Mediastinal shadows are normal. Severe chronic emphysema with bullous disease in the upper lungs right more than left and crowding of markings at the bases. Question superimposed interstitial pulmonary edema. No consolidation, collapse or effusion. IMPRESSION: Advanced emphysema. Bullous disease in the upper lobes, right worse than left. Crowding of markings at the bases. Interstitial markings appear more prominent than were seen in November, raising the possibility of superimposed interstitial edema. Electronically Signed   By: Nelson Chimes M.D.   On: 10/02/2021 11:11   ? ?EKG: RBBB with LPFB; ? CHB ? ?ASSESSMENT AND PLAN:  ?Eric Allison is a 61 year old male with history of tobacco dependence, hypertension, chronic bifascicular block who presents to the emergency department with 2 near syncopal episodes. ? 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  Interestingly his troponin is elevated which may indicate significant time of hypoperfusion, and given his risk factors for coronary disease I think it is worthwhile completing a heart catheterization prior to proceeding with pacemaker placement since VT/VF is also a consideration for his syncope.  ?- LHC tomorrow. Discussed at length with the patient who understands the risks and benefits.  ?- Pending ischemic eval, may need PPM placed prior to DC ?- Aspirin 81 mg indefinitely ?- Continue crestor 10 mg daily ?- Trend troponin until peak ?- Monitor on  telemetry with pacing pads in place, atropine for symptomatic bradycardia ? ? ?Signed: ?Andrez Grime MD ?10/02/2021, 4:07 PM ? ?

## 2021-10-02 NOTE — Assessment & Plan Note (Signed)
Unclear etiology ?We will cycle cardiac enzymes ?Patient had a recent normal Myoview stress test in 12/22 ?Hold aspirin for possible planned procedure in a.m. ?Continue statins ?

## 2021-10-02 NOTE — ED Notes (Signed)
Again requested urine sample. Pt unable to provide at this time. Urinal left with patient. ?

## 2021-10-02 NOTE — Assessment & Plan Note (Addendum)
Appears to be  related to complete AV block ?Hold metoprolol ?Pacer pads to ACW ?Consult cardiology for evaluation for possible pacemaker insertion ?IV fluid resuscitation ?Monitor closely for arrhythmias ?Patient had a 2D echocardiogram which was done in 12/22 showed an LVEF of 55% with no evidence of aortic stenosis. ?We will consult cardiology ?

## 2021-10-02 NOTE — ED Provider Notes (Signed)
? ?[facility] ?Provider Note ? ? ? Event Date/Time  ? First MD Initiated Contact with Patient 10/02/21 1042   ?  (approximate) ? ? ?History  ? ?Loss of Consciousness ? ? ?HPI ? ?Eric Allison is a 61 y.o. male history of presumed COPD current smoker to the ER for evaluation of syncopal episode that occurred today while was at work.  Denies any chest pain or pressure no shortness of breath.  She was having some discomfort when he was walking around at work.  Is not sure whether he completely lost consciousness or not did not fall or hit his head.  Below are himself to the ground and then does not remember what happened for the next few moments.  Denies any nausea or vomiting.  States he just feels fatigued and tired. ?  ? ? ?Physical Exam  ? ?Triage Vital Signs: ?ED Triage Vitals  ?Enc Vitals Group  ?   BP 10/02/21 1027 (!) 92/56  ?   Pulse Rate 10/02/21 1027 73  ?   Resp 10/02/21 1027 18  ?   Temp 10/02/21 1027 98 ?F (36.7 ?C)  ?   Temp Source 10/02/21 1027 Oral  ?   SpO2 10/02/21 1027 100 %  ?   Weight 10/02/21 1014 145 lb (65.8 kg)  ?   Height 10/02/21 1049 '5\' 11"'$  (1.803 m)  ?   Head Circumference --   ?   Peak Flow --   ?   Pain Score --   ?   Pain Loc --   ?   Pain Edu? --   ?   Excl. in [place]? --   ? ? ?Most recent vital signs: ?Vitals:  ? 10/02/21 1400 10/02/21 1500  ?BP: (!) 95/56 107/70  ?Pulse: 62 65  ?Resp: 16 16  ?Temp:    ?SpO2: 95% 96%  ? ? ? ?Constitutional: Alert  ?Eyes: Conjunctivae are normal.  ?Head: Atraumatic. ?Nose: No congestion/rhinnorhea. ?Mouth/Throat: Mucous membranes are moist.   ?Neck: Painless ROM.  ?Cardiovascular:   Good peripheral circulation. No mg/r/ ?Respiratory: Normal respiratory effort.  No retractions. No crackles  ?Gastrointestinal: Soft and nontender.  ?Musculoskeletal:  no deformity ?Neurologic:  MAE spontaneously. No gross focal neurologic deficits are appreciated.  ?Skin:  Skin is warm, dry and intact. No rash noted. ?Psychiatric: Mood and affect are  normal. Speech and behavior are normal. ? ? ? ?ED Results / Procedures / Treatments  ? ?Labs ?(all labs ordered are listed, but only abnormal results are displayed) ?Labs Reviewed  ?BASIC METABOLIC PANEL - Abnormal; Notable for the following components:  ?    Result Value  ? Glucose, Bld 158 (*)   ? All other components within normal limits  ?CBC - Abnormal; Notable for the following components:  ? WBC 11.0 (*)   ? All other components within normal limits  ?URINALYSIS, ROUTINE W REFLEX MICROSCOPIC - Abnormal; Notable for the following components:  ? Color, Urine YELLOW (*)   ? APPearance CLEAR (*)   ? Hgb urine dipstick SMALL (*)   ? Protein, ur 30 (*)   ? All other components within normal limits  ?TROPONIN I (HIGH SENSITIVITY) - Abnormal; Notable for the following components:  ? Troponin I (High Sensitivity) 19 (*)   ? All other components within normal limits  ?TROPONIN I (HIGH SENSITIVITY) - Abnormal; Notable for the following components:  ? Troponin I (High Sensitivity) 76 (*)   ? All other components within normal limits  ? ? ? ?  EKG ? ?ED ECG REPORT ?I, Merlyn Lot, the attending physician, personally viewed and interpreted this ECG. ? ? Date: 10/02/2021 ? EKG Time: 10:37 ? Rate: 75 ? Rhythm: sinus  ? Axis: right ? Intervals:prolonged pr, rbbb ? ST&T Change: Diffuse ST abnormality in setting of right bundle branch block morphology concerning for ischemia but appears overall consistent with previous tracing in November 2022. ? ? ? ?RADIOLOGY ?Please see ED Course for my review and interpretation. ? ?I personally reviewed all radiographic images ordered to evaluate for the above acute complaints and reviewed radiology reports and findings.  These findings were personally discussed with the patient.  Please see medical record for radiology report. ? ? ? ?PROCEDURES: ? ?Critical Care performed: No ? ?Procedures ? ? ?MEDICATIONS ORDERED IN ED: ?Medications  ?sodium chloride 0.9 % bolus 1,000 mL (0 mLs  Intravenous Stopped 10/02/21 1156)  ?aspirin chewable tablet 324 mg (324 mg Oral Given 10/02/21 1427)  ?sodium chloride 0.9 % bolus 500 mL (500 mLs Intravenous New Bag/Given 10/02/21 1427)  ? ? ? ?IMPRESSION / MDM / ASSESSMENT AND PLAN / ED COURSE  ?I reviewed the triage vital signs and the nursing notes. ?             ?               ? ?Differential diagnosis includes, but is not limited to, ACS, pericarditis, dysrhythmia, pe, dissection, pna, bronchitis, costochondritis ? ?Patient clinically very well-appearing presents to the ER for evaluation of symptoms as described above.  His initial EKG is concerning but the patient currently has no pain and on further review of his previous EKGs does not to have significantly changed.  Will order blood work placed on cardiac monitor and observe. ? ?Clinical Course as of 10/02/21 1522  ?Tue Oct 02, 2021  ?1242 Patient's repeat EKG no significant morphological changes I do suspect this is his baseline.  His troponin is only mildly elevated to 19.  We will continue to observe. [PR]  ?1316 Patient remains well-appearing clinically asymptomatic at this time. [PR]  ?1424 Patient's repeat troponin has increased fourfold.  Patient still without any pain.  Blood pressure little bit low at this point.  He denies any shortness of breath.  Based on his syncopal episode this point I do feel he will require hospitalization for further cardiac monitoring.  He remains in sinus rhythm on cardiac monitor. [PR]  ?  ?Clinical Course User Index ?[PR] Merlyn Lot, MD  ? ?Case discussed in consultation with hospitalist who agreed admit patient to their service. ? ? ?FINAL CLINICAL IMPRESSION(S) / ED DIAGNOSES  ? ?Final diagnoses:  ?Syncope, unspecified syncope type  ? ? ? ?Rx / DC Orders  ? ?ED Discharge Orders   ? ? None  ? ?  ? ? ? ?Note:  This document was prepared using Dragon voice recognition software and may include unintentional dictation errors. ? ?  ?Merlyn Lot, MD ?10/02/21  1522 ? ?

## 2021-10-02 NOTE — ED Triage Notes (Signed)
Pt comes ems from parking lot with syncope episode. Lowered himself to the ground (no fall) but period of LOC per pt. Aox4. C/o weakness at this time. Hx of afib. CBG 136. 20G L AC.  ?

## 2021-10-02 NOTE — ED Notes (Signed)
Critical result troponin 76 verbal in person to Dr. Merlyn Lot ?

## 2021-10-02 NOTE — Assessment & Plan Note (Signed)
Chronic Stable 

## 2021-10-02 NOTE — Assessment & Plan Note (Signed)
Patient admits to continued nicotine use ?Smoking cessation discussed patient in detail ?He declines a nicotine transdermal patch at this time ?

## 2021-10-02 NOTE — Plan of Care (Signed)
?  Problem: Clinical Measurements: ?Goal: Will remain free from infection ?Outcome: Not Applicable ?Goal: Respiratory complications will improve ?Outcome: Not Applicable ?  ?Problem: Nutrition: ?Goal: Adequate nutrition will be maintained ?Outcome: Not Applicable ?  ?Problem: Coping: ?Goal: Level of anxiety will decrease ?Outcome: Progressing ?  ?Problem: Pain Managment: ?Goal: General experience of comfort will improve ?Outcome: Progressing ?  ?

## 2021-10-02 NOTE — H&P (Signed)
?History and Physical  ? ? ?Patient: Eric Allison FIE:[id number] DOB: Nov 24, 1960 ?DOA: 10/02/2021 ?DOS: the patient was seen and examined on 10/02/2021 ?PCP: Ellene Route  ?Patient coming from: Home ? ?Chief Complaint:  ?Chief Complaint  ?Patient presents with  ? Loss of Consciousness  ? ?HPI: Terry Bolotin is a 61 y.o. male with medical history significant for nicotine dependence, hypertension, known chronic left bundle branch block on EKG status post recent work-up 12/22 which includes a 2D echocardiogram that showed a normal LVEF of about 55% and normal Myoview stress test which showed normal myocardial perfusion with no evidence of ischemia. ?Patient presents to the emergency room via EMS for evaluation of 2 near syncopal episodes. ?The patient he said he woke up this morning and felt very dizzy and lightheaded.  He felt like he was about to pass out and so he lowered himself to the ground.  He is unsure how long he was on the ground while he was able to get up and attempt to walk to his truck when he had another episode prompting him to call EMS. ?He had a similar episode in 07/22 and was referred to observation status for evaluation.  He has since followed up with cardiology and had work-up which included an echo and a Myoview stress test. ?He has had diarrhea but denies having any nausea or vomiting. ?He denies having any chest pain, no shortness of breath, no headache, no fever, no chills, no cough, no urinary symptoms, no blurred vision or focal deficit. ?He was hypotensive when he arrived to ER with a blood pressure of 92/56 ?Review of Systems: As mentioned in the history of present illness. All other systems reviewed and are negative. ?History reviewed. No pertinent past medical history. ?History reviewed. No pertinent surgical history. ?Social History:  reports that he has been smoking cigarettes. He has a 98.00 pack-year smoking history. He has never used smokeless tobacco. He reports current  alcohol use. He reports that he does not use drugs. ? ?No Known Allergies ? ?Family History  ?Problem Relation Age of Onset  ? Hypertension Mother   ? ? ?Prior to Admission medications   ?Medication Sig Start Date End Date Taking? Authorizing Provider  ?metoprolol tartrate (LOPRESSOR) 25 MG tablet Take 0.5 tablets (12.5 mg total) by mouth 2 (two) times daily. 05/06/21 10/02/21 Yes Blake Divine, MD  ?rosuvastatin (CRESTOR) 10 MG tablet Take 10 mg by mouth daily.   Yes [provider]  ?aspirin EC 81 MG EC tablet Take 1 tablet (81 mg total) by mouth daily. Swallow whole. ?Patient not taking: Reported on 10/02/2021 12/31/20   Enzo Bi, MD  ?atorvastatin (LIPITOR) 40 MG tablet Take 1 tablet (40 mg total) by mouth daily. ?Patient not taking: Reported on 10/02/2021 12/31/20 03/31/21  Enzo Bi, MD  ? ? ?Physical Exam: ?Vitals:  ? 10/02/21 1300 10/02/21 1330 10/02/21 1400 10/02/21 1500  ?BP: 108/68 106/68 (!) 95/56 107/70  ?Pulse: 70 62 62 65  ?Resp: '13 18 16 16  '$ ?Temp:      ?TempSrc:      ?SpO2: 100% 96% 95% 96%  ?Weight:      ?Height:      ? ?Physical Exam ?Vitals and nursing note reviewed.  ?Constitutional:   ?   Appearance: He is normal weight.  ?HENT:  ?   Head: Normocephalic and atraumatic.  ?   Nose: Nose normal.  ?   Mouth/Throat:  ?   Mouth: Mucous membranes are moist.  ?Eyes:  ?  Pupils: Pupils are equal, round, and reactive to light.  ?Cardiovascular:  ?   Rate and Rhythm: Normal rate.  ?Pulmonary:  ?   Effort: Pulmonary effort is normal.  ?   Breath sounds: Normal breath sounds.  ?Abdominal:  ?   General: Abdomen is flat. Bowel sounds are normal.  ?   Palpations: Abdomen is soft.  ?Musculoskeletal:     ?   General: Normal range of motion.  ?   Cervical back: Normal range of motion and neck supple.  ?Skin: ?   General: Skin is warm and dry.  ?Neurological:  ?   General: No focal deficit present.  ?   Mental Status: He is alert and oriented to person, place, and time.  ?Psychiatric:     ?   Mood and  Affect: Mood normal.     ?   Behavior: Behavior normal.  ? ? ?Data Reviewed: ?Relevant notes from primary care and specialist visits, past discharge summaries as available in EHR, including Care Everywhere. ?Prior diagnostic testing as pertinent to current admission diagnoses ?Updated medications and problem lists for reconciliation ?ED course, including vitals, labs, imaging, treatment and response to treatment ?Triage notes, nursing and pharmacy notes and ED provider's notes ?Notable results as noted in HPI ?Labs reviewed.  Troponin 19 >> 76 ?Sodium 135, potassium 3.7, chloride 101, bicarb 24, glucose 158, BUN 12, creatinine 1.03, calcium 8.9, white count 11.0, hemoglobin 14.3, hematocrit 43.9, platelet count 231 ?Chest x-ray reviewed by me shows advanced emphysema. Bullous disease in the upper lobes, right worse ?than left. Crowding of markings at the bases. Interstitial markings appear more prominent than were seen in November, raising the possibility of superimposed interstitial edema. ?Twelve-lead EKG reviewed by me shows complete AV block ?There are no new results to review at this time. ? ?Assessment and Plan: ?* Near syncope ?Appears to be  related to complete AV block ?Hold metoprolol ?Pacer pads to ACW ?Consult cardiology for evaluation for possible pacemaker insertion ?IV fluid resuscitation ?Monitor closely for arrhythmias ?Patient had a 2D echocardiogram which was done in 12/22 showed an LVEF of 55% with no evidence of aortic stenosis. ?We will consult cardiology ? 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 ?Elevated troponin ?Unclear etiology ?We will cycle cardiac enzymes ?Patient had a recent normal Myoview stress test in 12/22 ?Hold aspirin for possible planned procedure in a.m. ?Continue statins ? ?Nicotine dependence ?Patient admits to continued nicotine use ?Smoking cessation discussed patient in detail ?He declines a nicotine transdermal patch at this time ? ? ? ? ? Advance Care Planning:   Code  Status: Full Code  ? ?Consults: Cardiology ? ?Family Communication: Greater than 50% of time was spent discussing patient's condition and plan of care with him at the bedside.  Addressed.  He verbalizes understanding and agrees with the plan. ? ?Severity of Illness: ?The appropriate patient status for this patient is OBSERVATION. Observation status is judged to be reasonable and necessary in order to provide the required intensity of service to ensure the patient's safety. The patient's presenting symptoms, physical exam findings, and initial radiographic and laboratory data in the context of their medical condition is felt to place them at decreased risk for further clinical deterioration. Furthermore, it is anticipated that the patient will be medically stable for discharge from the hospital within 2 midnights of admission.  ? ?Author: ?Collier Bullock, MD ?10/02/2021 4:13 PM ? ?For on call review [email or web address].  ?

## 2021-10-03 ENCOUNTER — Encounter
Admission: EM | Disposition: A | Discharge status: Home / Self Care | Payer: Self-pay | Source: Home / Self Care | Attending: Internal Medicine

## 2021-10-03 ENCOUNTER — Encounter: Payer: Self-pay | Admitting: Cardiology

## 2021-10-03 DIAGNOSIS — R55 Syncope and collapse: Secondary | ICD-10-CM | POA: Diagnosis present

## 2021-10-03 DIAGNOSIS — F17210 Nicotine dependence, cigarettes, uncomplicated: Secondary | ICD-10-CM | POA: Diagnosis present

## 2021-10-03 DIAGNOSIS — D72823 Leukemoid reaction: Secondary | ICD-10-CM | POA: Diagnosis present

## 2021-10-03 DIAGNOSIS — I442 Atrioventricular block, complete: Secondary | ICD-10-CM | POA: Diagnosis present

## 2021-10-03 DIAGNOSIS — Z79899 Other long term (current) drug therapy: Secondary | ICD-10-CM | POA: Diagnosis not present

## 2021-10-03 DIAGNOSIS — I445 Left posterior fascicular block: Secondary | ICD-10-CM

## 2021-10-03 DIAGNOSIS — R197 Diarrhea, unspecified: Secondary | ICD-10-CM | POA: Diagnosis present

## 2021-10-03 DIAGNOSIS — I248 Other forms of acute ischemic heart disease: Secondary | ICD-10-CM | POA: Diagnosis present

## 2021-10-03 DIAGNOSIS — I10 Essential (primary) hypertension: Secondary | ICD-10-CM | POA: Diagnosis present

## 2021-10-03 DIAGNOSIS — E162 Hypoglycemia, unspecified: Secondary | ICD-10-CM | POA: Diagnosis not present

## 2021-10-03 DIAGNOSIS — Z7982 Long term (current) use of aspirin: Secondary | ICD-10-CM | POA: Diagnosis not present

## 2021-10-03 DIAGNOSIS — R778 Other specified abnormalities of plasma proteins: Secondary | ICD-10-CM

## 2021-10-03 DIAGNOSIS — I4510 Unspecified right bundle-branch block: Secondary | ICD-10-CM

## 2021-10-03 DIAGNOSIS — Z951 Presence of aortocoronary bypass graft: Secondary | ICD-10-CM | POA: Diagnosis not present

## 2021-10-03 DIAGNOSIS — Z8249 Family history of ischemic heart disease and other diseases of the circulatory system: Secondary | ICD-10-CM | POA: Diagnosis not present

## 2021-10-03 DIAGNOSIS — Z20822 Contact with and (suspected) exposure to covid-19: Secondary | ICD-10-CM | POA: Diagnosis present

## 2021-10-03 DIAGNOSIS — I959 Hypotension, unspecified: Secondary | ICD-10-CM | POA: Diagnosis present

## 2021-10-03 DIAGNOSIS — Z006 Encounter for examination for normal comparison and control in clinical research program: Secondary | ICD-10-CM | POA: Diagnosis not present

## 2021-10-03 DIAGNOSIS — R001 Bradycardia, unspecified: Secondary | ICD-10-CM

## 2021-10-03 HISTORY — PX: LEFT HEART CATH AND CORONARY ANGIOGRAPHY: CATH118249

## 2021-10-03 LAB — CBC
HCT: 37.8 % — ABNORMAL LOW (ref 39.0–52.0)
Hemoglobin: 12.4 g/dL — ABNORMAL LOW (ref 13.0–17.0)
MCH: 29.8 pg (ref 26.0–34.0)
MCHC: 32.8 g/dL (ref 30.0–36.0)
MCV: 90.9 fL (ref 80.0–100.0)
Platelets: 189 10*3/uL (ref 150–400)
RBC: 4.16 MIL/uL — ABNORMAL LOW (ref 4.22–5.81)
RDW: 13.7 % (ref 11.5–15.5)
WBC: 7.4 10*3/uL (ref 4.0–10.5)
nRBC: 0 % (ref 0.0–0.2)

## 2021-10-03 LAB — BASIC METABOLIC PANEL
Anion gap: 6 (ref 5–15)
BUN: 10 mg/dL (ref 6–20)
CO2: 23 mmol/L (ref 22–32)
Calcium: 8 mg/dL — ABNORMAL LOW (ref 8.9–10.3)
Chloride: 110 mmol/L (ref 98–111)
Creatinine, Ser: 0.93 mg/dL (ref 0.61–1.24)
GFR, Estimated: >60 mL/min (ref >60)
Glucose, Bld: 94 mg/dL (ref 70–99)
Potassium: 3.7 mmol/L (ref 3.5–5.1)
Sodium: 139 mmol/L (ref 135–145)

## 2021-10-03 LAB — GLUCOSE, CAPILLARY: Glucose-Capillary: 91 mg/dL (ref 70–99)

## 2021-10-03 SURGERY — LEFT HEART CATH AND CORONARY ANGIOGRAPHY
Anesthesia: Moderate Sedation

## 2021-10-03 MED ORDER — SODIUM CHLORIDE 0.9 % IV SOLN
250.0000 mL | INTRAVENOUS | Status: DC | PRN
  Administered 2021-10-04: 1000 mL via INTRAVENOUS

## 2021-10-03 MED ORDER — SODIUM CHLORIDE 0.9% FLUSH: 3.0000 mL | INTRAVENOUS | Status: DC | PRN

## 2021-10-03 MED ORDER — VERAPAMIL HCL 2.5 MG/ML IV SOLN
INTRAVENOUS | Status: DC | PRN
  Administered 2021-10-03: 2.5 mg via INTRACORONARY

## 2021-10-03 MED ORDER — HEPARIN (PORCINE) IN NACL 2000-0.9 UNIT/L-% IV SOLN
INTRAVENOUS | Status: DC | PRN
  Administered 2021-10-03: 1000 mL

## 2021-10-03 MED ORDER — HEPARIN (PORCINE) IN NACL 1000-0.9 UT/500ML-% IV SOLN
INTRAVENOUS | Status: AC
  Filled 2021-10-03: qty 1000

## 2021-10-03 MED ORDER — HEPARIN SODIUM (PORCINE) 1000 UNIT/ML IJ SOLN
INTRAMUSCULAR | Status: AC
  Filled 2021-10-03: qty 10

## 2021-10-03 MED ORDER — MIDAZOLAM HCL 2 MG/2ML IJ SOLN
INTRAMUSCULAR | Status: DC | PRN
  Administered 2021-10-03: 1 mg via INTRAVENOUS

## 2021-10-03 MED ORDER — LIDOCAINE HCL (PF) 1 % IJ SOLN
INTRAMUSCULAR | Status: DC | PRN
  Administered 2021-10-03: 2 mL

## 2021-10-03 MED ORDER — CEFAZOLIN SODIUM-DEXTROSE 2-4 GM/100ML-% IV SOLN: 2.0000 g | INTRAVENOUS | Status: DC

## 2021-10-03 MED ORDER — LIDOCAINE HCL 1 % IJ SOLN
INTRAMUSCULAR | Status: AC
  Filled 2021-10-03: qty 20

## 2021-10-03 MED ORDER — SODIUM CHLORIDE 0.45 % IV SOLN: INTRAVENOUS | Status: DC

## 2021-10-03 MED ORDER — VERAPAMIL HCL 2.5 MG/ML IV SOLN
INTRAVENOUS | Status: AC
  Filled 2021-10-03: qty 2

## 2021-10-03 MED ORDER — FENTANYL CITRATE (PF) 100 MCG/2ML IJ SOLN
INTRAMUSCULAR | Status: AC
  Filled 2021-10-03: qty 2

## 2021-10-03 MED ORDER — SODIUM CHLORIDE 0.9% FLUSH
3.0000 mL | Freq: Two times a day (BID) | INTRAVENOUS | Status: DC
  Administered 2021-10-03: 3 mL via INTRAVENOUS

## 2021-10-03 MED ORDER — FENTANYL CITRATE (PF) 100 MCG/2ML IJ SOLN
INTRAMUSCULAR | Status: DC | PRN
  Administered 2021-10-03: 25 ug via INTRAVENOUS

## 2021-10-03 MED ORDER — IOHEXOL 300 MG/ML  SOLN
INTRAMUSCULAR | Status: DC | PRN
  Administered 2021-10-03: 65 mL

## 2021-10-03 MED ORDER — SODIUM CHLORIDE 0.9 % IV SOLN: INTRAVENOUS | Status: DC

## 2021-10-03 MED ORDER — MIDAZOLAM HCL 2 MG/2ML IJ SOLN
INTRAMUSCULAR | Status: AC
  Filled 2021-10-03: qty 2

## 2021-10-03 MED ORDER — HEPARIN SODIUM (PORCINE) 1000 UNIT/ML IJ SOLN
INTRAMUSCULAR | Status: DC | PRN
  Administered 2021-10-03: 3000 [IU] via INTRAVENOUS

## 2021-10-03 SURGICAL SUPPLY — 11 items
CATH 5FR JL3.5 JR4 ANG PIG MP (CATHETERS) ×1 IMPLANT
DEVICE RAD TR BAND REGULAR (VASCULAR PRODUCTS) ×1 IMPLANT
DRAPE BRACHIAL (DRAPES) ×1 IMPLANT
GLIDESHEATH SLEND SS 6F .021 (SHEATH) ×1 IMPLANT
GUIDEWIRE INQWIRE 1.5J.035X260 (WIRE) IMPLANT
INQWIRE 1.5J .035X260CM (WIRE) ×2
KIT SYRINGE INJ CVI SPIKEX1 (MISCELLANEOUS) ×1 IMPLANT
PACK CARDIAC CATH (CUSTOM PROCEDURE TRAY) ×2 IMPLANT
PROTECTION STATION PRESSURIZED (MISCELLANEOUS) ×2
SET ATX SIMPLICITY (MISCELLANEOUS) ×1 IMPLANT
STATION PROTECTION PRESSURIZED (MISCELLANEOUS) IMPLANT

## 2021-10-03 NOTE — Consult Note (Signed)
[facility] Cardiology ? ?CARDIOLOGY CONSULT NOTE  ?Patient ID: ?Eric Allison ?MRN: [id number] ?DOB/AGE: 1960/07/12 61 y.o. ? ?Admit date: 10/02/2021 ?Referring Physician Agbata ?Primary Physician Ellene Route ?Primary Cardiologist Nehemiah Massed ?Reason for Consultation Syncope, AV dissociation ? ?HPI:  ?Eric Allison is a 61 year old male with history of tobacco dependence, hypertension, chronic bifascicular block who presents to the emergency department with 2 near syncopal episodes. ? ?Interval history: ?- LHC this morning with no CAD.  ?- No recurrent syncopal episodes.  ?- Feels well today. No chest pain or shortness of breath.  ? ?Review of systems complete and found to be negative unless listed above  ? ? ? ?History reviewed. No pertinent past medical history.  ?History reviewed. No pertinent surgical history.  ?Medications Prior to Admission  ?Medication Sig Dispense Refill Last Dose  ? metoprolol tartrate (LOPRESSOR) 25 MG tablet Take 0.5 tablets (12.5 mg total) by mouth 2 (two) times daily. 30 tablet 1 10/02/2021 at 0830  ? rosuvastatin (CRESTOR) 10 MG tablet Take 10 mg by mouth daily.   10/01/2021  ? aspirin EC 81 MG EC tablet Take 1 tablet (81 mg total) by mouth daily. Swallow whole. (Patient not taking: Reported on 10/02/2021)   Not Taking  ? atorvastatin (LIPITOR) 40 MG tablet Take 1 tablet (40 mg total) by mouth daily. (Patient not taking: Reported on 10/02/2021) 90 tablet 0 Not Taking  ? ? ?Social History  ? ?Socioeconomic History  ? Marital status: Divorced  ?  Spouse name: Not on file  ? Number of children: Not on file  ? Years of education: Not on file  ? Highest education level: Not on file  ?Occupational History  ? Not on file  ?Tobacco Use  ? Smoking status: Every Day  ?  Packs/day: 2.00  ?  Years: 49.00  ?  Pack years: 98.00  ?  Types: Cigarettes  ? Smokeless tobacco: Never  ?Substance and Sexual Activity  ? Alcohol use: Yes  ?  Comment: occ  ? Drug use: Never  ? Sexual activity: Not on file  ?Other  Topics Concern  ? Not on file  ?Social History Narrative  ? Not on file  ? ?Social Determinants of Health  ? ?Financial Resource Strain: Not on file  ?Food Insecurity: Not on file  ?Transportation Needs: Not on file  ?Physical Activity: Not on file  ?Stress: Not on file  ?Social Connections: Not on file  ?Intimate Partner Violence: Not on file  ?  ?Family History  ?Problem Relation Age of Onset  ? Hypertension Mother   ?  ? ? ?Review of systems complete and found to be negative unless listed above  ? ? ? ? ?PHYSICAL EXAM ?Vitals:  ? 10/03/21 0843 10/03/21 0845  ?BP: 128/84 126/67  ?Pulse: 72 67  ?Resp: 18 16  ?Temp:    ?SpO2: 96% 95%  ? ? ? ?General: Well developed, well nourished, in no acute distress ?HEENT:  Normocephalic and atramatic ?Neck:  No JVD.  ?Lungs: Clear bilaterally to auscultation and percussion. Decrease breath sounds ?Heart: Distant heart sounds. HRRR . Normal S1 and S2 without gallops or murmurs.  ?Abdomen: Bowel sounds are positive, abdomen soft and non-tender  ?Msk:  Back normal, normal gait. Normal strength and tone for age. ?Extremities: No clubbing, cyanosis or edema.   ?Neuro: Alert and oriented X 3. ?Psych:  Good affect, responds appropriately ? ?Labs: ?  ?Lab Results  ?Component Value Date  ? WBC 7.4 10/03/2021  ? HGB 12.4 (L) 10/03/2021  ?  HCT 37.8 (L) 10/03/2021  ? MCV 90.9 10/03/2021  ? PLT 189 10/03/2021  ?  ?Recent Labs  ?Lab 10/03/21 ?0410  ?NA 139  ?K 3.7  ?CL 110  ?CO2 23  ?BUN 10  ?CREATININE 0.93  ?CALCIUM 8.0*  ?GLUCOSE 94  ? ? ?Lab Results  ?Component Value Date  ? CKTOTAL 147 12/30/2020  ? ? No results found for: CHOL ?No results found for: HDL ?No results found for: [place] ?No results found for: TRIG ?No results found for: CHOLHDL ?No results found for: LDLDIRECT  ?  ?Radiology: CARDIAC CATHETERIZATION ? ?Result Date: 10/03/2021 ?  LV end diastolic pressure is normal.   There is no aortic valve stenosis. Conclusion: No left main coronary artery with separate ostium of the LAD  and Lcx No angiographically apparent coronary artery disease.  Normal LVEDP. Recommendation: Ongoing evaluation of syncope, with probable pacemaker placement tomorrow given bifascicular block. Aggressive secondary prevention Telemetry monitoring while inpatient.  ? ?DG Chest Portable 1 View ? ?Result Date: 10/02/2021 ?CLINICAL DATA:  Chest pain and dizziness EXAM: PORTABLE CHEST 1 VIEW COMPARISON:  05/06/2021 FINDINGS: Heart size is normal. Mediastinal shadows are normal. Severe chronic emphysema with bullous disease in the upper lungs right more than left and crowding of markings at the bases. Question superimposed interstitial pulmonary edema. No consolidation, collapse or effusion. IMPRESSION: Advanced emphysema. Bullous disease in the upper lobes, right worse than left. Crowding of markings at the bases. Interstitial markings appear more prominent than were seen in November, raising the possibility of superimposed interstitial edema. Electronically Signed   By: Nelson Chimes M.D.   On: 10/02/2021 11:11   ? ?EKG: RBBB with LPFB; ? CHB ? ?ASSESSMENT AND PLAN:  ?Eric Allison is a 61 year old male with history of tobacco dependence, hypertension, chronic bifascicular block who presents to the emergency department with 2 near syncopal episodes. ? 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  He has a bifascular block and thus with recurrent unexplained syncope is a reasonable candidate for PPM. His elevated troponin suggests that he had a significant period of hypoperfusion, likely from high grade AV block. Cath with no significant CAD.  ?- Plan for micra placement tomorrow. ?- Echo in December with normal EF. LV gram shows probably normal EF but difficult to assess.  ?- Aspirin 81 mg indefinitely ?-  Continue crestor 10 mg daily ?- Trend troponin until peak ?- Monitor on telemetry with pacing pads in place, atropine for symptomatic bradycardia ? ? ?Signed: ?Andrez Grime MD ?10/03/2021, 9:07 AM ? ?

## 2021-10-03 NOTE — Progress Notes (Addendum)
? ? ? ?Progress Note  ? ? ?Eric Allison  ZTI:[id number] DOB: 1961-02-24  DOA: 10/02/2021 ?PCP: Ellene Route  ? ? ? ? ?Brief Narrative:  ? ? ?Medical records reviewed and are as summarized below: ? ?Eric Allison is a 61 y.o. male with medical history significant for tobacco use disorder, hypertension, chronic right bundle branch block, normal 2D echo and myocardial perfusion stress test in December 2022.  He presented to the hospital because of 2 episodes of near syncope. ? ? ? ? ? ?Assessment/Plan:  ? ?Principal Problem: ?  Near syncope ?Active Problems: ?  Nicotine dependence ?  Elevated troponin ?  Leukemoid reaction ?  Bradycardia ?  Right bundle branch block ?  Left posterior fascicular block ? ? ? ?Body mass index is 18.95 kg/m?. ? ? ? ?Near syncope, right bundle branch block with left posterior fascicular block, elevated troponin: S/p left heart cath which showed normal coronaries.  Plan for permanent pacemaker tomorrow.   ?Elevated troponin most likely from demand ischemia.  Recent 2D echo in December 2022 showed EF estimated at 55% and myocardial perfusion stress test in December 2022 was normal. ? ?Tobacco use disorder: Counseled to quit smoking cigarettes ? ?Leukocytosis: Resolved ? ?Diet Order   ? ?       ?  Diet Heart Room service appropriate? Yes; Fluid consistency: Thin  Diet effective now       ?  ? ?  ?  ? ?  ? ? ? ? ? ? ? ? ? ? ?Consultants: ?Cardiologist ? ?Procedures: ?Left heart cath on 10/03/2021 ? ? ? ?Medications:  ? ? aspirin EC  81 mg Oral Daily  ? rosuvastatin  10 mg Oral Daily  ? sodium chloride flush  3 mL Intravenous Q12H  ? sodium chloride flush  3 mL Intravenous Q12H  ? sodium chloride flush  3 mL Intravenous Q12H  ? sodium chloride flush  3 mL Intravenous Q12H  ? ?Continuous Infusions: ? sodium chloride    ? sodium chloride    ? ? ? ?Anti-infectives (From admission, onward)  ? ? Start     Dose/Rate Route Frequency Ordered Stop  ? 10/03/21 0900  ceFAZolin (ANCEF) IVPB  2g/100 mL premix  Status:  Discontinued       ? 2 g ?200 mL/hr over 30 Minutes Intravenous On call 10/03/21 0857 10/03/21 0858  ? ?  ? ? ? ? ? ? ? ? ? ?Family Communication/Anticipated D/C date and plan/Code Status  ? ?DVT prophylaxis: SCD's Start: 10/03/21 0853 ?SCDs Start: 10/02/21 1609 ? ? ?  Code Status: Full Code ? ?Family Communication: Plan discussed with his mother at the bedside ?Disposition Plan: Plan to discharge home on 10/05/2021 ? ? ?Status is: Inpatient ?Remains inpatient appropriate because: Plan for pacemaker placement tomorrow ? ? ? ? ? ? ?Subjective:  ? ?Interval events noted.  No chest pain, shortness of breath, dizziness or palpitations. ? ?Objective:  ? ? ?Vitals:  ? 10/03/21 1000 10/03/21 1015 10/03/21 1030 10/03/21 1128  ?BP: 123/70 123/70 127/72 119/82  ?Pulse: 61 68 (!) 57 62  ?Resp: '17 20 13 18  '$ ?Temp:    98.1 ?F (36.7 ?C)  ?TempSrc:      ?SpO2: 94% 96% 95% 96%  ?Weight:      ?Height:      ? ?No data found. ? ? ?Intake/Output Summary (Last 24 hours) at 10/03/2021 1312 ?Last data filed at 10/03/2021 1130 ?Gross per 24 hour  ?Intake 2111.22 ml  ?  Output 400 ml  ?Net 1711.22 ml  ? ?Filed Weights  ? 10/02/21 1049 10/02/21 2017 10/03/21 0500  ?Weight: 66.7 kg 61.6 kg 61.6 kg  ? ? ?Exam: ? ?GEN: NAD ?SKIN: No rash ?EYES: EOMI ?ENT: MMM ?CV: RRR ?PULM: CTA B ?ABD: soft, ND, NT, +BS ?CNS: AAO x 3, non focal ?EXT: No edema or tenderness ? ? ? ?  ? ? ?Data Reviewed:  ? ?I have personally reviewed following labs and imaging studies: ? ?Labs: ?Labs show the following:  ? ?Basic Metabolic Panel: ?Recent Labs  ?Lab 10/02/21 ?1027 10/03/21 ?0410  ?NA 135 139  ?K 3.7 3.7  ?CL 101 110  ?CO2 24 23  ?GLUCOSE 158* 94  ?BUN 12 10  ?CREATININE 1.03 0.93  ?CALCIUM 8.9 8.0*  ? ?GFR ?Estimated Creatinine Clearance: 73.6 mL/min (by C-G formula based on SCr of 0.93 mg/dL). ?Liver Function Tests: ?No results for input(s): AST, ALT, ALKPHOS, BILITOT, PROT, ALBUMIN in the last 168 hours. ?No results for input(s): LIPASE,  AMYLASE in the last 168 hours. ?No results for input(s): AMMONIA in the last 168 hours. ?Coagulation profile ?No results for input(s): INR, PROTIME in the last 168 hours. ? ?CBC: ?Recent Labs  ?Lab 10/02/21 ?1027 10/03/21 ?0410  ?WBC 11.0* 7.4  ?HGB 14.3 12.4*  ?HCT 43.9 37.8*  ?MCV 90.7 90.9  ?PLT 231 189  ? ?Cardiac Enzymes: ?No results for input(s): CKTOTAL, CKMB, CKMBINDEX, TROPONINI in the last 168 hours. ?BNP (last 3 results) ?No results for input(s): PROBNP in the last 8760 hours. ?CBG: ?Recent Labs  ?Lab 10/03/21 ?0543  ?GLUCAP 91  ? ?D-Dimer: ?No results for input(s): DDIMER in the last 72 hours. ?Hgb A1c: ?No results for input(s): HGBA1C in the last 72 hours. ?Lipid Profile: ?No results for input(s): CHOL, HDL, LDLCALC, TRIG, CHOLHDL, LDLDIRECT in the last 72 hours. ?Thyroid function studies: ?Recent Labs  ?  10/02/21 ?1027  ?TSH 1.665  ? ?Anemia work up: ?No results for input(s): VITAMINB12, FOLATE, FERRITIN, TIBC, IRON, RETICCTPCT in the last 72 hours. ?Sepsis Labs: ?Recent Labs  ?Lab 10/02/21 ?1027 10/03/21 ?0410  ?WBC 11.0* 7.4  ? ? ?Microbiology ?No results found for this or any previous visit (from the past 240 hour(s)). ? ?Procedures and diagnostic studies: ? ?CARDIAC CATHETERIZATION ? ?Result Date: 10/03/2021 ?  LV end diastolic pressure is normal.   There is no aortic valve stenosis. Conclusion: No left main coronary artery with separate ostium of the LAD and Lcx No angiographically apparent coronary artery disease.  Normal LVEDP. Recommendation: Ongoing evaluation of syncope, with probable pacemaker placement tomorrow given bifascicular block. Aggressive secondary prevention Telemetry monitoring while inpatient.  ? ?DG Chest Portable 1 View ? ?Result Date: 10/02/2021 ?CLINICAL DATA:  Chest pain and dizziness EXAM: PORTABLE CHEST 1 VIEW COMPARISON:  05/06/2021 FINDINGS: Heart size is normal. Mediastinal shadows are normal. Severe chronic emphysema with bullous disease in the upper lungs right more  than left and crowding of markings at the bases. Question superimposed interstitial pulmonary edema. No consolidation, collapse or effusion. IMPRESSION: Advanced emphysema. Bullous disease in the upper lobes, right worse than left. Crowding of markings at the bases. Interstitial markings appear more prominent than were seen in November, raising the possibility of superimposed interstitial edema. Electronically Signed   By: Nelson Chimes M.D.   On: 10/02/2021 11:11   ? ? ? ? ? ? ? ? ? ? ? ? LOS: 0 days  ? ?Elad Macphail  ?[facility]  ? ?Pager on [email or web address]. If 7PM-7AM,  please contact night-coverage at [email or web address] ? ? ? ? ?10/03/2021, 1:12 PM  ? ? ? ? ? ? ? ? ? ?

## 2021-10-04 ENCOUNTER — Encounter
Admission: EM | Disposition: A | Discharge status: Home / Self Care | Payer: Self-pay | Source: Home / Self Care | Attending: Internal Medicine

## 2021-10-04 ENCOUNTER — Encounter: Payer: Self-pay | Admitting: Cardiology

## 2021-10-04 DIAGNOSIS — I4510 Unspecified right bundle-branch block: Secondary | ICD-10-CM | POA: Diagnosis not present

## 2021-10-04 DIAGNOSIS — R55 Syncope and collapse: Secondary | ICD-10-CM | POA: Diagnosis not present

## 2021-10-04 DIAGNOSIS — I445 Left posterior fascicular block: Secondary | ICD-10-CM | POA: Diagnosis not present

## 2021-10-04 DIAGNOSIS — R001 Bradycardia, unspecified: Secondary | ICD-10-CM | POA: Diagnosis not present

## 2021-10-04 HISTORY — PX: PACEMAKER LEADLESS INSERTION: EP1219

## 2021-10-04 LAB — GLUCOSE, CAPILLARY
Glucose-Capillary: 68 mg/dL — ABNORMAL LOW (ref 70–99)
Glucose-Capillary: 161 mg/dL — ABNORMAL HIGH (ref 70–99)

## 2021-10-04 SURGERY — PACEMAKER LEADLESS INSERTION
Anesthesia: Moderate Sedation

## 2021-10-04 MED ORDER — ONDANSETRON HCL 4 MG/2ML IJ SOLN: 4.0000 mg | Freq: Four times a day (QID) | INTRAMUSCULAR | Status: DC | PRN

## 2021-10-04 MED ORDER — ACETAMINOPHEN 325 MG PO TABS: 650.0000 mg | ORAL_TABLET | ORAL | Status: DC | PRN

## 2021-10-04 MED ORDER — MIDAZOLAM HCL 2 MG/2ML IJ SOLN
INTRAMUSCULAR | Status: AC
  Filled 2021-10-04: qty 2

## 2021-10-04 MED ORDER — FENTANYL CITRATE (PF) 100 MCG/2ML IJ SOLN
INTRAMUSCULAR | Status: AC
  Filled 2021-10-04: qty 2

## 2021-10-04 MED ORDER — SODIUM CHLORIDE 0.9% FLUSH
3.0000 mL | Freq: Two times a day (BID) | INTRAVENOUS | Status: DC
  Administered 2021-10-04: 3 mL via INTRAVENOUS

## 2021-10-04 MED ORDER — HEPARIN (PORCINE) IN NACL 1000-0.9 UT/500ML-% IV SOLN
INTRAVENOUS | Status: AC
  Filled 2021-10-04: qty 1000

## 2021-10-04 MED ORDER — MIDAZOLAM HCL 2 MG/2ML IJ SOLN
INTRAMUSCULAR | Status: DC | PRN
  Administered 2021-10-04 (×2): 1 mg via INTRAVENOUS

## 2021-10-04 MED ORDER — HEPARIN (PORCINE) IN NACL 1000-0.9 UT/500ML-% IV SOLN
INTRAVENOUS | Status: DC | PRN
  Administered 2021-10-04: 1000 mL

## 2021-10-04 MED ORDER — SODIUM CHLORIDE 0.9 % IV SOLN: 250.0000 mL | INTRAVENOUS | Status: DC | PRN

## 2021-10-04 MED ORDER — DEXTROSE 50 % IV SOLN: 12.5000 g | INTRAVENOUS | Status: AC

## 2021-10-04 MED ORDER — DEXTROSE 50 % IV SOLN
INTRAVENOUS | Status: AC
  Administered 2021-10-04: 12.5 g via INTRAVENOUS
  Filled 2021-10-04: qty 50

## 2021-10-04 MED ORDER — LIDOCAINE HCL (PF) 1 % IJ SOLN
INTRAMUSCULAR | Status: DC | PRN
Start: 2021-10-04 — End: 2021-10-04
  Administered 2021-10-04: 20 mL

## 2021-10-04 MED ORDER — SODIUM CHLORIDE 0.9% FLUSH: 3.0000 mL | INTRAVENOUS | Status: DC | PRN

## 2021-10-04 MED ORDER — FENTANYL CITRATE (PF) 100 MCG/2ML IJ SOLN
INTRAMUSCULAR | Status: DC | PRN
  Administered 2021-10-04 (×2): 25 ug via INTRAVENOUS

## 2021-10-04 MED ORDER — IOHEXOL 300 MG/ML  SOLN
INTRAMUSCULAR | Status: DC | PRN
  Administered 2021-10-04: 17 mL

## 2021-10-04 MED ORDER — LIDOCAINE HCL 1 % IJ SOLN
INTRAMUSCULAR | Status: AC
  Filled 2021-10-04: qty 20

## 2021-10-04 MED ORDER — SODIUM CHLORIDE 0.9 % IV BOLUS
INTRAVENOUS | Status: AC | PRN
  Administered 2021-10-04: 250 mL via INTRAVENOUS

## 2021-10-04 MED ORDER — HEPARIN SODIUM (PORCINE) 1000 UNIT/ML IJ SOLN
INTRAMUSCULAR | Status: DC | PRN
  Administered 2021-10-04: 1500 [IU] via INTRAVENOUS
  Administered 2021-10-04: 3000 [IU] via INTRAVENOUS

## 2021-10-04 MED ORDER — HEPARIN SODIUM (PORCINE) 1000 UNIT/ML IJ SOLN
INTRAMUSCULAR | Status: AC
  Filled 2021-10-04: qty 10

## 2021-10-04 SURGICAL SUPPLY — 20 items
CATH INFINITI JR4 5F (CATHETERS) ×1 IMPLANT
DILATOR VESSEL 38 20CM 12FR (INTRODUCER) ×1 IMPLANT
DILATOR VESSEL 38 20CM 14FR (INTRODUCER) ×1 IMPLANT
DILATOR VESSEL 38 20CM 18FR (INTRODUCER) ×1 IMPLANT
DILATOR VESSEL 38 20CM 8FR (INTRODUCER) ×1 IMPLANT
KIT SYRINGE INJ CVI SPIKEX1 (MISCELLANEOUS) ×1 IMPLANT
MICRA AV TRANSCATH PACING SYS (Pacemaker) ×2 IMPLANT
MICRA INTRODUCER SHEATH (SHEATH) ×2
NDL PERC 18GX7CM (NEEDLE) IMPLANT
NEEDLE PERC 18GX7CM (NEEDLE) ×2 IMPLANT
PACK CARDIAC CATH (CUSTOM PROCEDURE TRAY) ×2 IMPLANT
PAD ELECT DEFIB RADIOL ZOLL (MISCELLANEOUS) ×1 IMPLANT
PROTECTION STATION PRESSURIZED (MISCELLANEOUS) ×2
SHEATH AVANTI 7FRX11 (SHEATH) ×1 IMPLANT
SHEATH INTRODUCER MICRA (SHEATH) IMPLANT
STATION PROTECTION PRESSURIZED (MISCELLANEOUS) IMPLANT
SUT SILK 0 FSL (SUTURE) ×1 IMPLANT
SYSTEM PACING TRNSCTH AV MICRA (Pacemaker) IMPLANT
WIRE AMPLATZ SS-J .035X180CM (WIRE) ×1 IMPLANT
WIRE GUIDERIGHT .035X150 (WIRE) ×1 IMPLANT

## 2021-10-04 NOTE — Progress Notes (Signed)
? ? ? ?Progress Note  ? ? ?Eric Allison  QZE:[id number] DOB: 12/25/1960  DOA: 10/02/2021 ?PCP: Ellene Route  ? ? ? ? ?Brief Narrative:  ? ? ?Medical records reviewed and are as summarized below: ? ?Eric Allison is a 61 y.o. male with medical history significant for tobacco use disorder, hypertension, chronic right bundle branch block, normal 2D echo and myocardial perfusion stress test in December 2022.  He presented to the hospital because of 2 episodes of near syncope. ? ? ? ? ? ?Assessment/Plan:  ? ?Principal Problem: ?  Near syncope ?Active Problems: ?  Nicotine dependence ?  Elevated troponin ?  Leukemoid reaction ?  Bradycardia ?  Right bundle branch block ?  Left posterior fascicular block ? ? ? ?Body mass index is 19.04 kg/m?. ? ? ? ?Near syncope, right bundle branch block with left posterior fascicular block, elevated troponin: S/p left heart cath on 10/03/2021 which showed normal coronaries.  S/p Micra AV leadless pacemaker implantation today.  Follow-up with cardiologist. ? ?Elevated troponin most likely from demand ischemia.  Recent 2D echo in December 2022 showed EF estimated at 55% and myocardial perfusion stress test in December 2022 was normal. ? ?Tobacco use disorder: Counseled to quit smoking cigarettes ? ?S/p hypoglycemia from recent n.p.o. status.  He was asymptomatic.  Diet has been resumed. ? ?Leukocytosis: Resolved ? ?Diet Order   ? ?       ?  Diet Heart Room service appropriate? Yes; Fluid consistency: Thin  Diet effective now       ?  ? ?  ?  ? ?  ? ? ? ? ? ? ? ? ? ? ? ? ?Consultants: ?Cardiologist ? ?Procedures: ?Left heart cath on 10/03/2021 ?Pacemaker implantation on 10/04/2021 ? ? ? ?Medications:  ? ? aspirin EC  81 mg Oral Daily  ? rosuvastatin  10 mg Oral Daily  ? sodium chloride flush  3 mL Intravenous Q12H  ? sodium chloride flush  3 mL Intravenous Q12H  ? sodium chloride flush  3 mL Intravenous Q12H  ? sodium chloride flush  3 mL Intravenous Q12H  ? sodium chloride flush   3 mL Intravenous Q12H  ? ?Continuous Infusions: ? sodium chloride    ? sodium chloride 1,000 mL (10/04/21 0747)  ? sodium chloride    ? ? ? ?Anti-infectives (From admission, onward)  ? ? Start     Dose/Rate Route Frequency Ordered Stop  ? 10/03/21 0900  ceFAZolin (ANCEF) IVPB 2g/100 mL premix  Status:  Discontinued       ? 2 g ?200 mL/hr over 30 Minutes Intravenous On call 10/03/21 0857 10/03/21 0858  ? ?  ? ? ? ? ? ? ? ? ? ?Family Communication/Anticipated D/C date and plan/Code Status  ? ?DVT prophylaxis: SCD's Start: 10/03/21 0853 ?SCDs Start: 10/02/21 1609 ? ? ?  Code Status: Full Code ? ?Family Communication: Plan discussed with his mother at the bedside ?Disposition Plan: Plan to discharge home on 10/05/2021 ? ? ?Status is: Inpatient ?Remains inpatient appropriate because: Plan for pacemaker placement tomorrow ? ? ? ? ? ? ?Subjective:  ? ?Interval events noted.  He had permanent pacemaker placed this morning.  He has no complaints.  No chest pain, shortness of breath or palpitations. ?His mother was at the bedside. ? ?Objective:  ? ? ?Vitals:  ? 10/04/21 0953 10/04/21 1000 10/04/21 1047 10/04/21 1115  ?BP: 130/74 106/62 (!) 141/80 132/76  ?Pulse: 78 73 72 73  ?Resp: 16 14  16 18  ?Temp:    98 ?F (36.7 ?C)  ?TempSrc:      ?SpO2: 98% 95% 93% 95%  ?Weight:      ?Height:      ? ?No data found. ? ? ?Intake/Output Summary (Last 24 hours) at 10/04/2021 1247 ?Last data filed at 10/03/2021 1600 ?Gross per 24 hour  ?Intake 240 ml  ?Output 700 ml  ?Net -460 ml  ? ?Filed Weights  ? 10/02/21 2017 10/03/21 0500 10/04/21 0410  ?Weight: 61.6 kg 61.6 kg 61.9 kg  ? ? ?Exam: ? ?GEN: NAD ?SKIN: No rash.  Dressing on right groin is clean, dry and intact ?EYES: EOM ?ENT: MMM ?CV: RRR ?PULM: CTA B ?ABD: soft, ND, NT, +BS ?CNS: AAO x 3, non focal ?EXT: No edema or tenderness ? ? ? ?  ? ? ?Data Reviewed:  ? ?I have personally reviewed following labs and imaging studies: ? ?Labs: ?Labs show the following:  ? ?Basic Metabolic Panel: ?Recent  Labs  ?Lab 10/02/21 ?1027 10/03/21 ?0410  ?NA 135 139  ?K 3.7 3.7  ?CL 101 110  ?CO2 24 23  ?GLUCOSE 158* 94  ?BUN 12 10  ?CREATININE 1.03 0.93  ?CALCIUM 8.9 8.0*  ? ?GFR ?Estimated Creatinine Clearance: 74 mL/min (by C-G formula based on SCr of 0.93 mg/dL). ?Liver Function Tests: ?No results for input(s): AST, ALT, ALKPHOS, BILITOT, PROT, ALBUMIN in the last 168 hours. ?No results for input(s): LIPASE, AMYLASE in the last 168 hours. ?No results for input(s): AMMONIA in the last 168 hours. ?Coagulation profile ?No results for input(s): INR, PROTIME in the last 168 hours. ? ?CBC: ?Recent Labs  ?Lab 10/02/21 ?1027 10/03/21 ?0410  ?WBC 11.0* 7.4  ?HGB 14.3 12.4*  ?HCT 43.9 37.8*  ?MCV 90.7 90.9  ?PLT 231 189  ? ?Cardiac Enzymes: ?No results for input(s): CKTOTAL, CKMB, CKMBINDEX, TROPONINI in the last 168 hours. ?BNP (last 3 results) ?No results for input(s): PROBNP in the last 8760 hours. ?CBG: ?Recent Labs  ?Lab 10/03/21 ?0543 10/04/21 ?0450 10/04/21 ?2637  ?GLUCAP 91 68* 161*  ? ?D-Dimer: ?No results for input(s): DDIMER in the last 72 hours. ?Hgb A1c: ?No results for input(s): HGBA1C in the last 72 hours. ?Lipid Profile: ?No results for input(s): CHOL, HDL, LDLCALC, TRIG, CHOLHDL, LDLDIRECT in the last 72 hours. ?Thyroid function studies: ?Recent Labs  ?  10/02/21 ?1027  ?TSH 1.665  ? ?Anemia work up: ?No results for input(s): VITAMINB12, FOLATE, FERRITIN, TIBC, IRON, RETICCTPCT in the last 72 hours. ?Sepsis Labs: ?Recent Labs  ?Lab 10/02/21 ?1027 10/03/21 ?0410  ?WBC 11.0* 7.4  ? ? ?Microbiology ?No results found for this or any previous visit (from the past 240 hour(s)). ? ?Procedures and diagnostic studies: ? ?CARDIAC CATHETERIZATION ? ?Result Date: 10/03/2021 ?  LV end diastolic pressure is normal.   There is no aortic valve stenosis. Conclusion: No left main coronary artery with separate ostium of the LAD and Lcx No angiographically apparent coronary artery disease.  Normal LVEDP. Recommendation: Ongoing  evaluation of syncope, with probable pacemaker placement tomorrow given bifascicular block. Aggressive secondary prevention Telemetry monitoring while inpatient.  ? ?EP PPM/ICD IMPLANT ? ?Result Date: 10/04/2021 ?Successful Micra AV leadless pacemaker implantation   ? ? ? ? ? ? ? ? ? ? ? ? LOS: 1 day  ? ?Zamere Pasternak  ?[facility]  ? ?Pager on [email or web address]. If 7PM-7AM, please contact night-coverage at [email or web address] ? ? ? ? ?10/04/2021, 12:47 PM  ? ? ? ? ? ? ? ? ? ?

## 2021-10-04 NOTE — Progress Notes (Signed)
[facility] Cardiology ? ?CARDIOLOGY CONSULT NOTE  ?Patient ID: ?Eric Allison ?MRN: [id number] ?DOB/AGE: 07-13-1960 61 y.o. ? ?Admit date: 10/02/2021 ?Referring Physician Agbata ?Primary Physician Ellene Route ?Primary Cardiologist Nehemiah Massed ?Reason for Consultation Syncope, AV dissociation ? ?HPI:  ?Eric Allison is a 61 year old male with history of tobacco dependence, hypertension, chronic bifascicular block who presents to the emergency department with 2 near syncopal episodes. [place] 10/03/21 with no CAD. Underwent micra leadless pacemaker placement on 10/04/21 with Dr. Saralyn Pilar without complication.  ? ?Interval history: ?- had micra pacemaker placed this morning, feels well this afternoon without complaint  ?- eager to get OOB, no chest pain, dizziness, presyncope, syncope ? ? ?Review of systems complete and found to be negative unless listed above  ? ?History reviewed. No pertinent past medical history.  ?Past Surgical History:  ?Procedure Laterality Date  ? LEFT HEART CATH AND CORONARY ANGIOGRAPHY N/A 10/03/2021  ? Procedure: LEFT HEART CATH AND CORONARY ANGIOGRAPHY;  Surgeon: Andrez Grime, MD;  Location: [place] [facility];  Service: Cardiovascular;  Laterality: N/A;  ?  ?Medications Prior to Admission  ?Medication Sig Dispense Refill Last Dose  ? metoprolol tartrate (LOPRESSOR) 25 MG tablet Take 0.5 tablets (12.5 mg total) by mouth 2 (two) times daily. 30 tablet 1 10/02/2021 at 0830  ? rosuvastatin (CRESTOR) 10 MG tablet Take 10 mg by mouth daily.   10/01/2021  ? aspirin EC 81 MG EC tablet Take 1 tablet (81 mg total) by mouth daily. Swallow whole. (Patient not taking: Reported on 10/02/2021)   Not Taking  ? atorvastatin (LIPITOR) 40 MG tablet Take 1 tablet (40 mg total) by mouth daily. (Patient not taking: Reported on 10/02/2021) 90 tablet 0 Not Taking  ? ? ?Social History  ? ?Socioeconomic History  ? Marital status: Divorced  ?  Spouse name: Not on file  ? Number of children: Not on file  ? Years of  education: Not on file  ? Highest education level: Not on file  ?Occupational History  ? Not on file  ?Tobacco Use  ? Smoking status: Every Day  ?  Packs/day: 2.00  ?  Years: 49.00  ?  Pack years: 98.00  ?  Types: Cigarettes  ? Smokeless tobacco: Never  ?Substance and Sexual Activity  ? Alcohol use: Yes  ?  Comment: occ  ? Drug use: Never  ? Sexual activity: Not on file  ?Other Topics Concern  ? Not on file  ?Social History Narrative  ? Not on file  ? ?Social Determinants of Health  ? ?Financial Resource Strain: Not on file  ?Food Insecurity: Not on file  ?Transportation Needs: Not on file  ?Physical Activity: Not on file  ?Stress: Not on file  ?Social Connections: Not on file  ?Intimate Partner Violence: Not on file  ?  ?Family History  ?Problem Relation Age of Onset  ? Hypertension Mother   ?  ? ? ?Review of systems complete and found to be negative unless listed above  ? ? ? ? ?PHYSICAL EXAM ?Vitals:  ? 10/04/21 1047 10/04/21 1115  ?BP: (!) 141/80 132/76  ?Pulse: 72 73  ?Resp: 16 18  ?Temp:  98 ?F (36.7 ?C)  ?SpO2: 93% 95%  ? ? ? ?General: Pleasant Caucasian male Well developed, well nourished, in no acute distress. Laying flat in PCU bed.  ?HEENT:  Normocephalic and atramatic ?Neck:  No JVD.  ?Lungs: Clear bilaterally to auscultation. Decreased breath sounds ?Heart: Distant heart sounds. HRRR . Normal S1 and S2 without gallops  or murmurs.  ?Abdomen: non distended appearing ?Msk:  Normal strength and tone for age. ?Extremities: No clubbing, cyanosis or edema.   ?Neuro: Alert and oriented X 3. ?Psych:  Good affect, responds appropriately ? ?Labs: ?  ?Lab Results  ?Component Value Date  ? WBC 7.4 10/03/2021  ? HGB 12.4 (L) 10/03/2021  ? HCT 37.8 (L) 10/03/2021  ? MCV 90.9 10/03/2021  ? PLT 189 10/03/2021  ?  ?Recent Labs  ?Lab 10/03/21 ?0410  ?NA 139  ?K 3.7  ?CL 110  ?CO2 23  ?BUN 10  ?CREATININE 0.93  ?CALCIUM 8.0*  ?GLUCOSE 94  ? ? ?Lab Results  ?Component Value Date  ? CKTOTAL 147 12/30/2020  ? ? No results  found for: CHOL ?No results found for: HDL ?No results found for: [place] ?No results found for: TRIG ?No results found for: CHOLHDL ?No results found for: LDLDIRECT  ?  ?Radiology: CARDIAC CATHETERIZATION ? ?Result Date: 10/03/2021 ?  LV end diastolic pressure is normal.   There is no aortic valve stenosis. Conclusion: No left main coronary artery with separate ostium of the LAD and Lcx No angiographically apparent coronary artery disease.  Normal LVEDP. Recommendation: Ongoing evaluation of syncope, with probable pacemaker placement tomorrow given bifascicular block. Aggressive secondary prevention Telemetry monitoring while inpatient.  ? ?EP PPM/ICD IMPLANT ? ?Result Date: 10/04/2021 ?Successful Micra AV leadless pacemaker implantation  ? ?DG Chest Portable 1 View ? ?Result Date: 10/02/2021 ?CLINICAL DATA:  Chest pain and dizziness EXAM: PORTABLE CHEST 1 VIEW COMPARISON:  05/06/2021 FINDINGS: Heart size is normal. Mediastinal shadows are normal. Severe chronic emphysema with bullous disease in the upper lungs right more than left and crowding of markings at the bases. Question superimposed interstitial pulmonary edema. No consolidation, collapse or effusion. IMPRESSION: Advanced emphysema. Bullous disease in the upper lobes, right worse than left. Crowding of markings at the bases. Interstitial markings appear more prominent than were seen in November, raising the possibility of superimposed interstitial edema. Electronically Signed   By: Nelson Chimes M.D.   On: 10/02/2021 11:11   ? ?EKG: RBBB with LPFB; ? CHB ? ?Telemetry: post micra on 4/13, NSR 70s-80s without pacing ? ?ASSESSMENT AND PLAN:  ?Eric Allison is a 61 year old male with history of tobacco dependence, hypertension, chronic bifascicular block who presents to the emergency department with 2 near syncopal episodes. [place] 10/03/21 with no CAD. Underwent micra leadless pacemaker placement on 10/04/21 with Dr. Saralyn Pilar without complication.   ? 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  He has a bifascular block and thus with recurrent unexplained syncope is a reasonable candidate for PPM. His elevated troponin suggests that he had a significant period of hypoperfusion, likely from high grade AV block. Cath with no significant CAD.  ?- s/p micra placement this morning without untoward event. ?- Echo in December with normal EF. LV gram shows probably normal EF but difficult to assess.  ?- Aspirin 81 mg indefinitely ?- Continue crestor 10 mg daily ?- Troponin peaked at 93 on 4/11 ?- continuous monitoring on telemetry  ?- likely ok for discharge tomorrow after suture removal and if patient is ambulating and feeling well  ? ?This patient's plan of care was discussed and created with Dr. Donnelly Angelica and he is in agreement.   ? ?Signed: ?Tristan Schroeder, PA-C ?  10/04/2021, 1:16 PM ? ?

## 2021-10-04 NOTE — Progress Notes (Signed)
bleeding occurred at pts right groin site - manual pressure held - bleeding has stopped - no hematoma present / distal pulse strong / no pain reported/ MD made aware/ will continue to monitor site ?

## 2021-10-04 NOTE — Progress Notes (Signed)
Hypoglycemic Event ? ?CBG: 68 ? ?Treatment: D50 25 mL (12.5 gm) ? ?Symptoms: None ? ?Follow-up CBG: [id number] CBG Result:161 ? ?Possible Reasons for Event: Inadequate meal intake ? ?Comments/MD notified:None  ? ? ? ?Eric Allison ? ? ?

## 2021-10-04 NOTE — Plan of Care (Signed)
?  Problem: Health Behavior/Discharge Planning: ?Goal: Ability to manage health-related needs will improve ?Outcome: Progressing ?  ?Problem: Clinical Measurements: ?Goal: Cardiovascular complication will be avoided ?Outcome: Progressing ?  ?Problem: Activity: ?Goal: Risk for activity intolerance will decrease ?Outcome: Progressing ?  ?Problem: Coping: ?Goal: Level of anxiety will decrease ?Outcome: Progressing ?  ?Problem: Elimination: ?Goal: Will not experience complications related to bowel motility ?Outcome: Progressing ?  ?Problem: Elimination: ?Goal: Will not experience complications related to urinary retention ?Outcome: Progressing ?  ?Problem: Pain Managment: ?Goal: General experience of comfort will improve ?Outcome: Progressing ?  ?Problem: Safety: ?Goal: Ability to remain free from injury will improve ?Outcome: Progressing ?  ?

## 2021-10-05 DIAGNOSIS — R55 Syncope and collapse: Secondary | ICD-10-CM | POA: Diagnosis not present

## 2021-10-05 DIAGNOSIS — R001 Bradycardia, unspecified: Secondary | ICD-10-CM | POA: Diagnosis not present

## 2021-10-05 DIAGNOSIS — I4510 Unspecified right bundle-branch block: Secondary | ICD-10-CM | POA: Diagnosis not present

## 2021-10-05 DIAGNOSIS — Z951 Presence of aortocoronary bypass graft: Secondary | ICD-10-CM

## 2021-10-05 DIAGNOSIS — I445 Left posterior fascicular block: Secondary | ICD-10-CM | POA: Diagnosis not present

## 2021-10-05 LAB — BASIC METABOLIC PANEL
Anion gap: 7 (ref 5–15)
BUN: 8 mg/dL (ref 6–20)
CO2: 24 mmol/L (ref 22–32)
Calcium: 8.5 mg/dL — ABNORMAL LOW (ref 8.9–10.3)
Chloride: 103 mmol/L (ref 98–111)
Creatinine, Ser: 0.83 mg/dL (ref 0.61–1.24)
GFR, Estimated: >60 mL/min (ref >60)
Glucose, Bld: 104 mg/dL — ABNORMAL HIGH (ref 70–99)
Potassium: 4 mmol/L (ref 3.5–5.1)
Sodium: 134 mmol/L — ABNORMAL LOW (ref 135–145)

## 2021-10-05 LAB — GLUCOSE, CAPILLARY: Glucose-Capillary: 108 mg/dL — ABNORMAL HIGH (ref 70–99)

## 2021-10-05 NOTE — Discharge Summary (Signed)
?Physician Discharge Summary ?  ?Patient: Eric Allison MRN: [id number] DOB: [birth year]  ?Admit date:     10/02/2021  ?Discharge date: 10/05/2021  ?Discharge Physician: Eric Allison  ? ?PCP: Eric Allison  ? ?Recommendations at discharge:  ? ? ?Follow-up with Dr. Nehemiah Allison, cardiologist, in 1 week ? ?Discharge Diagnoses: ?Principal Problem: ?  Near syncope ?Active Problems: ?  Nicotine dependence ?  Elevated troponin ?  Leukemoid reaction ?  Bradycardia ?  Right bundle branch block ?  Left posterior fascicular block ? ?Resolved Problems: ?  * No resolved hospital problems. * ? ?Hospital Course: ? ?Mr. Eric Allison is a 61 y.o. male with medical history significant for tobacco use disorder, hypertension, chronic right bundle branch block, normal 2D echo and myocardial perfusion stress test in December 2022. He presented to the hospital because of 2 episodes of near syncope. ?  ?His troponins were elevated and EKG showed right bundle branch block with left posterior fascicular block.  He underwent left heart cath which showed normal coronaries.  Micra AV leadless pacemaker was implanted for high degree AV block.  His condition has improved and he is deemed stable for discharge to home today.  Cardiologist recommended that patient be excused from work until follow-up with cardiologist in the office for further recommendations.  Patient requested a work note which was provided.  ? ? ? ? ? ?  ? ? ?Consultants: Cardiologist ?Procedures performed: Left heart cath, pacemaker implantation ?Disposition: Home ?Diet recommendation:  ?Discharge Diet Orders (From admission, onward)  ? ?  Start     Ordered  ? 10/05/21 0000  Diet - low sodium heart healthy       ? 10/05/21 0950  ? ?  ?  ? ?  ? ?Cardiac diet ?DISCHARGE MEDICATION: ?Allergies as of 10/05/2021   ?No Known Allergies ?  ? ?  ?Medication List  ?  ? ?STOP taking these medications   ? ?atorvastatin 40 MG tablet ?Commonly known as: LIPITOR ?  ? ?  ? ?TAKE these  medications   ? ?aspirin 81 MG EC tablet ?Take 1 tablet (81 mg total) by mouth daily. Swallow whole. ?  ?metoprolol tartrate 25 MG tablet ?Commonly known as: LOPRESSOR ?Take 0.5 tablets (12.5 mg total) by mouth 2 (two) times daily. ?  ?rosuvastatin 10 MG tablet ?Commonly known as: CRESTOR ?Take 10 mg by mouth daily. ?  ? ?  ? ? Follow-up Information   ? ? Eric Skains, MD. Go in 1 week(s).   ?Specialty: Cardiology ?Contact information: ?[address] ?[facility] ?[place] [address] ?[phone] ? ? ?  ?  ? ?  ?  ? ?  ? ?Discharge Exam: ?Filed Weights  ? 10/03/21 0500 10/04/21 0410 10/05/21 0513  ?Weight: 61.6 kg 61.9 kg 61.7 kg  ? ?GEN: NAD ?SKIN: No rash.  No bleeding noted from right femoral puncture site ?EYES: EOMI ?ENT: MMM ?CV: RRR ?PULM: CTA B ?ABD: soft, ND, NT, +BS ?CNS: AAO x 3, non focal ?EXT: No edema or tenderness ? ? ?Condition at discharge: good ? ?The results of significant diagnostics from this hospitalization (including imaging, microbiology, ancillary and laboratory) are listed below for reference.  ? ?Imaging Studies: ?CARDIAC CATHETERIZATION ? ?Result Date: 10/03/2021 ?  LV end diastolic pressure is normal.   There is no aortic valve stenosis. Conclusion: No left main coronary artery with separate ostium of the LAD and Lcx No angiographically apparent coronary artery disease.  Normal LVEDP. Recommendation: Ongoing evaluation  of syncope, with probable pacemaker placement tomorrow given bifascicular block. Aggressive secondary prevention Telemetry monitoring while inpatient.  ? ?EP PPM/ICD IMPLANT ? ?Result Date: 10/04/2021 ?Successful Micra AV leadless pacemaker implantation  ? ?DG Chest Portable 1 View ? ?Result Date: 10/02/2021 ?CLINICAL DATA:  Chest pain and dizziness EXAM: PORTABLE CHEST 1 VIEW COMPARISON:  05/06/2021 FINDINGS: Heart size is normal. Mediastinal shadows are normal. Severe chronic emphysema with bullous disease in the upper lungs right more  than left and crowding of markings at the bases. Question superimposed interstitial pulmonary edema. No consolidation, collapse or effusion. IMPRESSION: Advanced emphysema. Bullous disease in the upper lobes, right worse than left. Crowding of markings at the bases. Interstitial markings appear more prominent than were seen in November, raising the possibility of superimposed interstitial edema. Electronically Signed   By: Nelson Chimes M.D.   On: 10/02/2021 11:11   ? ?Microbiology: ?Results for orders placed or performed during the hospital encounter of 05/06/21  ?Resp Panel by RT-PCR (Flu A&B, Covid) Nasopharyngeal Swab     Status: None  ? Collection Time: 05/06/21 12:08 PM  ? Specimen: Nasopharyngeal Swab; Nasopharyngeal(NP) swabs in vial transport medium  ?Result Value Ref Range Status  ? SARS Coronavirus 2 by RT PCR NEGATIVE NEGATIVE Final  ?  Comment: (NOTE) ?SARS-CoV-2 target nucleic acids are NOT DETECTED. ? ?The SARS-CoV-2 RNA is generally detectable in upper respiratory ?specimens during the acute phase of infection. The lowest ?concentration of SARS-CoV-2 viral copies this assay can detect is ?138 copies/mL. A negative result does not preclude SARS-Cov-2 ?infection and should not be used as the sole basis for treatment or ?other patient management decisions. A negative result may occur with  ?improper specimen collection/handling, submission of specimen other ?than nasopharyngeal swab, presence of viral mutation(s) within the ?areas targeted by this assay, and inadequate number of viral ?copies(<138 copies/mL). A negative result must be combined with ?clinical observations, patient history, and epidemiological ?information. The expected result is Negative. ? ?Fact Sheet for Patients:  ?EntrepreneurPulse.com.au ? ?Fact Sheet for Healthcare Providers:  ?IncredibleEmployment.be ? ?This test is no t yet approved or cleared by the [place] FDA and  ?has been authorized  for detection and/or diagnosis of SARS-CoV-2 by ?FDA under an Emergency Use Authorization (EUA). This EUA will remain  ?in effect (meaning this test can be used) for the duration of the ?COVID-19 declaration under Section 564(b)(1) of the Act, 21 ?U.S.C.section 360bbb-3(b)(1), unless the authorization is terminated  ?or revoked sooner.  ? ? ?  ? Influenza A by PCR NEGATIVE NEGATIVE Final  ? Influenza B by PCR NEGATIVE NEGATIVE Final  ?  Comment: (NOTE) ?The Xpert Xpress SARS-CoV-2/FLU/RSV plus assay is intended as an aid ?in the diagnosis of influenza from Nasopharyngeal swab specimens and ?should not be used as a sole basis for treatment. Nasal washings and ?aspirates are unacceptable for Xpert Xpress SARS-CoV-2/FLU/RSV ?testing. ? ?Fact Sheet for Patients: ?EntrepreneurPulse.com.au ? ?Fact Sheet for Healthcare Providers: ?IncredibleEmployment.be ? ?This test is not yet approved or cleared by the [place] FDA and ?has been authorized for detection and/or diagnosis of SARS-CoV-2 by ?FDA under an Emergency Use Authorization (EUA). This EUA will remain ?in effect (meaning this test can be used) for the duration of the ?COVID-19 declaration under Section 564(b)(1) of the Act, 21 U.S.C. ?section 360bbb-3(b)(1), unless the authorization is terminated or ?revoked. ? ?Performed at [facility], [place], ?[place] [address] ?  ? ? ?Labs: ?CBC: ?Recent Labs  ?Lab 10/02/21 ?[place]  10/03/21 ?0410  ?WBC 11.0* 7.4  ?HGB 14.3 12.4*  ?HCT 43.9 37.8*  ?MCV 90.7 90.9  ?PLT 231 189  ? ?Basic Metabolic Panel: ?Recent Labs  ?Lab 10/02/21 ?1027 10/03/21 ?0410 10/05/21 ?0158  ?NA 135 139 134*  ?K 3.7 3.7 4.0  ?CL 101 110 103  ?CO2 '24 23 24  '$ ?GLUCOSE 158* 94 104*  ?BUN '12 10 8  '$ ?CREATININE 1.03 0.93 0.83  ?CALCIUM 8.9 8.0* 8.5*  ? ?Liver Function Tests: ?No results for input(s): AST, ALT, ALKPHOS, BILITOT, PROT, ALBUMIN in the last 168 hours. ?CBG: ?Recent Labs  ?Lab  10/03/21 ?0543 10/04/21 ?0450 10/04/21 ?6825 10/05/21 ?7493  ?GLUCAP 91 68* 161* 108*  ? ? ?Discharge time spent: greater than 30 minutes. ? ?Signed: ?Eric Boroughs, MD ?[facility] ?10/05/2021 ?

## 2021-10-05 NOTE — Progress Notes (Signed)
[facility] Cardiology ? ?CARDIOLOGY CONSULT NOTE  ?Patient ID: ?Eric Allison ?MRN: [id number] ?DOB/AGE: [birth year] 61 y.o. ? ?Admit date: 10/02/2021 ?Referring Physician Agbata ?Primary Physician Ellene Route ?Primary Cardiologist Nehemiah Massed ?Reason for Consultation Syncope, AV dissociation ? ?HPI:  ?Eric Allison is a 61 year old male with history of tobacco dependence, hypertension, chronic bifascicular block who presents to the emergency department with 2 near syncopal episodes. [place] 10/03/21 with no CAD. Underwent micra leadless pacemaker placement on 10/04/21 with Dr. Saralyn Pilar without complication.  ? ?Interval history: ?- no acute events, eager to go home ?- continues to deny chest pain, dizziness, presyncope, syncope ?- removed suture at bedside uneventfully  ? ? ?Review of systems complete and found to be negative unless listed above  ? ?History reviewed. No pertinent past medical history.  ?Past Surgical History:  ?Procedure Laterality Date  ? LEFT HEART CATH AND CORONARY ANGIOGRAPHY N/A 10/03/2021  ? Procedure: LEFT HEART CATH AND CORONARY ANGIOGRAPHY;  Surgeon: Andrez Grime, MD;  Location: [facility];  Service: Cardiovascular;  Laterality: N/A;  ? PACEMAKER LEADLESS INSERTION N/A 10/04/2021  ? Procedure: PACEMAKER LEADLESS INSERTION;  Surgeon: Isaias Cowman, MD;  Location: [facility];  Service: Cardiovascular;  Laterality: N/A;  ?  ?Medications Prior to Admission  ?Medication Sig Dispense Refill Last Dose  ? metoprolol tartrate (LOPRESSOR) 25 MG tablet Take 0.5 tablets (12.5 mg total) by mouth 2 (two) times daily. 30 tablet 1 10/02/2021 at 0830  ? rosuvastatin (CRESTOR) 10 MG tablet Take 10 mg by mouth daily.   10/01/2021  ? aspirin EC 81 MG EC tablet Take 1 tablet (81 mg total) by mouth daily. Swallow whole. (Patient not taking: Reported on 10/02/2021)   Not Taking  ? atorvastatin (LIPITOR) 40 MG tablet Take 1 tablet (40 mg total) by mouth daily. (Patient not taking: Reported  on 10/02/2021) 90 tablet 0 Not Taking  ? ? ?Social History  ? ?Socioeconomic History  ? Marital status: Divorced  ?  Spouse name: Not on file  ? Number of children: Not on file  ? Years of education: Not on file  ? Highest education level: Not on file  ?Occupational History  ? Not on file  ?Tobacco Use  ? Smoking status: Every Day  ?  Packs/day: 2.00  ?  Years: 49.00  ?  Pack years: 98.00  ?  Types: Cigarettes  ? Smokeless tobacco: Never  ?Substance and Sexual Activity  ? Alcohol use: Yes  ?  Comment: occ  ? Drug use: Never  ? Sexual activity: Not on file  ?Other Topics Concern  ? Not on file  ?Social History Narrative  ? Not on file  ? ?Social Determinants of Health  ? ?Financial Resource Strain: Not on file  ?Food Insecurity: Not on file  ?Transportation Needs: Not on file  ?Physical Activity: Not on file  ?Stress: Not on file  ?Social Connections: Not on file  ?Intimate Partner Violence: Not on file  ?  ?Family History  ?Problem Relation Age of Onset  ? Hypertension Mother   ?  ? ? ?Review of systems complete and found to be negative unless listed above  ? ? ? ? ?PHYSICAL EXAM ?Vitals:  ? 10/05/21 0430 10/05/21 0735  ?BP: 105/70 114/71  ?Pulse:  64  ?Resp: 16 20  ?Temp: 98 ?F (36.7 ?C) 98.3 ?F (36.8 ?C)  ?SpO2: 96% 97%  ? ? ? ?General: Pleasant Caucasian male Well developed, well nourished, in no acute distress. Laying flat in PCU bed.  ?  HEENT:  Normocephalic and atramatic ?Neck:  No JVD.  ?Lungs: Clear bilaterally to auscultation. Decreased breath sounds ?Heart: Distant heart sounds. HRRR . Normal S1 and S2 without gallops or murmurs.  ?Abdomen: non distended appearing ?Msk:  Normal strength and tone for age. ?Extremities: No clubbing, cyanosis or edema.   ?Right groin: minimal ecchymosis lateral to incision. Incision with minimal tenderness to palpation, small amount of oozing blood. No apparent aneurysm. Covered with dermabond, gauze and tegaderm.  ?Neuro: Alert and oriented X 3. ?Psych:  Good affect, responds  appropriately ? ?Labs: ?  ?Lab Results  ?Component Value Date  ? WBC 7.4 10/03/2021  ? HGB 12.4 (L) 10/03/2021  ? HCT 37.8 (L) 10/03/2021  ? MCV 90.9 10/03/2021  ? PLT 189 10/03/2021  ?  ?Recent Labs  ?Lab 10/05/21 ?1638  ?NA 134*  ?K 4.0  ?CL 103  ?CO2 24  ?BUN 8  ?CREATININE 0.83  ?CALCIUM 8.5*  ?GLUCOSE 104*  ? ? ?Lab Results  ?Component Value Date  ? CKTOTAL 147 12/30/2020  ? ? No results found for: CHOL ?No results found for: HDL ?No results found for: [place] ?No results found for: TRIG ?No results found for: CHOLHDL ?No results found for: LDLDIRECT  ?  ?Radiology: CARDIAC CATHETERIZATION ? ?Result Date: 10/03/2021 ?  LV end diastolic pressure is normal.   There is no aortic valve stenosis. Conclusion: No left main coronary artery with separate ostium of the LAD and Lcx No angiographically apparent coronary artery disease.  Normal LVEDP. Recommendation: Ongoing evaluation of syncope, with probable pacemaker placement tomorrow given bifascicular block. Aggressive secondary prevention Telemetry monitoring while inpatient.  ? ?EP PPM/ICD IMPLANT ? ?Result Date: 10/04/2021 ?Successful Micra AV leadless pacemaker implantation  ? ?DG Chest Portable 1 View ? ?Result Date: 10/02/2021 ?CLINICAL DATA:  Chest pain and dizziness EXAM: PORTABLE CHEST 1 VIEW COMPARISON:  05/06/2021 FINDINGS: Heart size is normal. Mediastinal shadows are normal. Severe chronic emphysema with bullous disease in the upper lungs right more than left and crowding of markings at the bases. Question superimposed interstitial pulmonary edema. No consolidation, collapse or effusion. IMPRESSION: Advanced emphysema. Bullous disease in the upper lobes, right worse than left. Crowding of markings at the bases. Interstitial markings appear more prominent than were seen in November, raising the possibility of superimposed interstitial edema. Electronically Signed   By: Nelson Chimes M.D.   On: 10/02/2021 11:11   ? ?EKG: RBBB with LPFB; ? CHB ? ?Telemetry:  post micra on 4/13, NSR 70s-80s without pacing ? ?ASSESSMENT AND PLAN:  ?Eric Allison is a 61 year old male with history of tobacco dependence, hypertension, chronic bifascicular block who presents to the emergency department with 2 near syncopal episodes. [place] 10/03/21 with no CAD. Underwent micra leadless pacemaker placement on 10/04/21 with Dr. Saralyn Pilar without complication.  ? 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  He has a bifascular block and thus with recurrent unexplained syncope is a reasonable candidate for PPM. His elevated troponin suggests that he had a significant period of hypoperfusion, likely from high grade AV block. Cath with no significant CAD.  ?- s/p micra placement this morning without untoward event. ?- Echo in December with normal EF. LV gram shows probably normal EF but difficult to assess.  ?- Aspirin 81 mg indefinitely ?- Continue crestor 10 mg daily ?-  continue metoprolol tartrate 12.'5mg'$  BID ?- Troponin peaked at 93 on 4/11 ?- continuous monitoring on telemetry  ?- ok for discharge today from a cardiac standpoint. Will need close follow up with Dr. Nehemiah Massed in 1 week.  ? ?This patient's plan of care was discussed and created with Dr. Donnelly Angelica and he is in agreement.   ? ?Signed: ?Tristan Schroeder, PA-C ?10/05/2021, 9:01 AM ? ?

## 2021-10-05 NOTE — Discharge Instructions (Signed)
Please avoid showering/submerging yourself in water for the next week. If your bandage gets wet or starts to fall off, replace it with another piece of gauze and the Tegaderm (clear bandage I provided). You should try to keep it covered for a week. You will follow up with Dr. Kowalski in the office in about 1 week. If you have bleeding from the site, lie down and apply firm pressure for 20 minutes, if it continues to bleed, call our office ([phone]). Avoid heavy lifting, squatting (other than sitting) and strenuous activity for 1 week. You will get a call from [employer] to set up your pacemaker and the [employer] device. You do not need to bring this device with you to appointments. It is used to monitor your pacemaker from home.  

## 2022-01-25 ENCOUNTER — Encounter: Payer: Self-pay | Admitting: Gastroenterology

## 2022-01-27 ENCOUNTER — Encounter: Payer: Self-pay | Admitting: Gastroenterology

## 2022-01-27 NOTE — H&P (Signed)
Pre-Procedure H&P   Patient ID: Eric Allison is a 61 y.o. male.  Gastroenterology Provider: Annamaria Helling, DO  Referring Provider: Dawson Bills, NP PCP: Ellene Route  Date: 01/28/2022  HPI Mr. Eric Allison is a 61 y.o. male who presents today for Colonoscopy for Initial screening colonoscopy. Patient status post pacemaker.  History of left bundle branch block, atrial fib not on anticoagulation, emphysema, CAD and carotid stenosis.  Active tobacco smoker 1/2 to 1 pack a day for 50 years.  No family history of colon cancer or colon polyps. Patient reports bowel movements normal without melena hematochezia diarrhea or constipation. Most recent lab work hemoglobin 14.3 MCV 90.7 platelets 231,000 creatinine 0.8 viral hepatitis negative No other acute GI complaints  Past Medical History:  Diagnosis Date   A-fib ([place])    Aortic atherosclerosis ([place])    Coronary artery disease    Dyspnea    Emphysema lung ([place])    Presence of permanent cardiac pacemaker     Past Surgical History:  Procedure Laterality Date   INGUINAL HERNIA REPAIR     KNEE ARTHROSCOPY Right 1987   LEFT HEART CATH AND CORONARY ANGIOGRAPHY N/A 10/03/2021   Procedure: LEFT HEART CATH AND CORONARY ANGIOGRAPHY;  Surgeon: Andrez Grime, MD;  Location: [facility];  Service: Cardiovascular;  Laterality: N/A;   PACEMAKER LEADLESS INSERTION N/A 10/04/2021   Procedure: PACEMAKER LEADLESS INSERTION;  Surgeon: Isaias Cowman, MD;  Location: [facility];  Service: Cardiovascular;  Laterality: N/A;    Family History No h/o GI disease or malignancy  Review of Systems  Constitutional:  Negative for activity change, appetite change, chills, diaphoresis, fatigue, fever and unexpected weight change.  HENT:  Negative for trouble swallowing and voice change.   Respiratory:  Negative for shortness of breath and wheezing.   Cardiovascular:  Negative for chest pain, palpitations and leg  swelling.  Gastrointestinal:  Negative for abdominal distention, abdominal pain, anal bleeding, blood in stool, constipation, diarrhea, nausea and vomiting.  Musculoskeletal:  Negative for arthralgias and myalgias.  Skin:  Negative for color change and pallor.  Neurological:  Negative for dizziness, syncope and weakness.  Psychiatric/Behavioral:  Negative for confusion. The patient is not nervous/anxious.   All other systems reviewed and are negative.    Medications No current facility-administered medications on file prior to encounter.   Current Outpatient Medications on File Prior to Encounter  Medication Sig Dispense Refill   aspirin EC 81 MG EC tablet Take 1 tablet (81 mg total) by mouth daily. Swallow whole.     metoprolol tartrate (LOPRESSOR) 25 MG tablet Take 0.5 tablets (12.5 mg total) by mouth 2 (two) times daily. 30 tablet 1   rosuvastatin (CRESTOR) 10 MG tablet Take 10 mg by mouth daily.      Pertinent medications related to GI and procedure were reviewed by me with the patient prior to the procedure   Current Facility-Administered Medications:    0.9 %  sodium chloride infusion, , Intravenous, Continuous, Annamaria Helling, DO, Last Rate: 20 mL/hr at 01/28/22 0740, New Bag at 01/28/22 0740      No Known Allergies Allergies were reviewed by me prior to the procedure  Objective   Body mass index is 20.22 kg/m. Vitals:   01/28/22 0732  BP: (!) 110/57  Pulse: 70  Resp: 20  Temp: (!) 97 F (36.1 C)  TempSrc: Temporal  SpO2: 98%  Weight: 65.8 kg  Height: '5\' 11"'$  (1.803 m)  Physical Exam Vitals and nursing note reviewed.  Constitutional:      General: He is not in acute distress.    Appearance: Normal appearance. He is not ill-appearing, toxic-appearing or diaphoretic.  HENT:     Head: Normocephalic and atraumatic.     Nose: Nose normal.     Mouth/Throat:     Mouth: Mucous membranes are moist.     Pharynx: Oropharynx is clear.  Eyes:      General: No scleral icterus.    Extraocular Movements: Extraocular movements intact.  Cardiovascular:     Rate and Rhythm: Normal rate and regular rhythm.     Heart sounds: Normal heart sounds. No murmur heard.    No friction rub. No gallop.  Pulmonary:     Effort: Pulmonary effort is normal. No respiratory distress.     Breath sounds: Normal breath sounds. No wheezing, rhonchi or rales.  Abdominal:     General: Abdomen is flat. Bowel sounds are normal. There is no distension.     Palpations: Abdomen is soft.     Tenderness: There is no abdominal tenderness. There is no guarding or rebound.  Musculoskeletal:     Cervical back: Neck supple.     Right lower leg: No edema.     Left lower leg: No edema.  Skin:    General: Skin is warm and dry.     Coloration: Skin is not jaundiced or pale.  Neurological:     General: No focal deficit present.     Mental Status: He is alert and oriented to person, place, and time. Mental status is at baseline.  Psychiatric:        Mood and Affect: Mood normal.        Behavior: Behavior normal.        Thought Content: Thought content normal.        Judgment: Judgment normal.      Assessment:  Mr. Eric Allison is a 61 y.o. male  who presents today for Colonoscopy for initial screening colonoscopy.  Plan:  Colonoscopy with possible intervention today  Colonoscopy with possible biopsy, control of bleeding, polypectomy, and interventions as necessary has been discussed with the patient/patient representative. Informed consent was obtained from the patient/patient representative after explaining the indication, nature, and risks of the procedure including but not limited to death, bleeding, perforation, missed neoplasm/lesions, cardiorespiratory compromise, and reaction to medications. Opportunity for questions was given and appropriate answers were provided. Patient/patient representative has verbalized understanding is amenable to undergoing the  procedure.   Annamaria Helling, DO  [facility] Gastroenterology  Portions of the record may have been created with voice recognition software. Occasional wrong-word or 'sound-a-like' substitutions may have occurred due to the inherent limitations of voice recognition software.  Read the chart carefully and recognize, using context, where substitutions may have occurred.

## 2022-01-28 ENCOUNTER — Encounter: Payer: Self-pay | Admitting: Gastroenterology

## 2022-01-28 ENCOUNTER — Encounter
Admission: RE | Disposition: A | Discharge status: Home / Self Care | Payer: Self-pay | Source: Home / Self Care | Attending: Gastroenterology

## 2022-01-28 ENCOUNTER — Ambulatory Visit: Payer: 59 | Admitting: General Practice

## 2022-01-28 ENCOUNTER — Ambulatory Visit
Admission: RE | Admit: 2022-01-28 | Discharge: 2022-01-28 | Disposition: A | Discharge status: Home / Self Care | Payer: 59 | Attending: Gastroenterology | Admitting: Gastroenterology

## 2022-01-28 ENCOUNTER — Other Ambulatory Visit: Payer: Self-pay

## 2022-01-28 DIAGNOSIS — K640 First degree hemorrhoids: Secondary | ICD-10-CM | POA: Diagnosis not present

## 2022-01-28 DIAGNOSIS — Z950 Presence of cardiac pacemaker: Secondary | ICD-10-CM | POA: Insufficient documentation

## 2022-01-28 DIAGNOSIS — K644 Residual hemorrhoidal skin tags: Secondary | ICD-10-CM | POA: Insufficient documentation

## 2022-01-28 DIAGNOSIS — Z1211 Encounter for screening for malignant neoplasm of colon: Secondary | ICD-10-CM | POA: Insufficient documentation

## 2022-01-28 DIAGNOSIS — D128 Benign neoplasm of rectum: Secondary | ICD-10-CM | POA: Diagnosis not present

## 2022-01-28 DIAGNOSIS — I4891 Unspecified atrial fibrillation: Secondary | ICD-10-CM | POA: Insufficient documentation

## 2022-01-28 DIAGNOSIS — L918 Other hypertrophic disorders of the skin: Secondary | ICD-10-CM | POA: Diagnosis not present

## 2022-01-28 DIAGNOSIS — I2510 Atherosclerotic heart disease of native coronary artery without angina pectoris: Secondary | ICD-10-CM | POA: Insufficient documentation

## 2022-01-28 DIAGNOSIS — F17210 Nicotine dependence, cigarettes, uncomplicated: Secondary | ICD-10-CM | POA: Insufficient documentation

## 2022-01-28 DIAGNOSIS — D122 Benign neoplasm of ascending colon: Secondary | ICD-10-CM | POA: Insufficient documentation

## 2022-01-28 DIAGNOSIS — I6529 Occlusion and stenosis of unspecified carotid artery: Secondary | ICD-10-CM | POA: Insufficient documentation

## 2022-01-28 DIAGNOSIS — K5730 Diverticulosis of large intestine without perforation or abscess without bleeding: Secondary | ICD-10-CM | POA: Insufficient documentation

## 2022-01-28 DIAGNOSIS — D124 Benign neoplasm of descending colon: Secondary | ICD-10-CM | POA: Insufficient documentation

## 2022-01-28 DIAGNOSIS — J439 Emphysema, unspecified: Secondary | ICD-10-CM | POA: Diagnosis not present

## 2022-01-28 DIAGNOSIS — I447 Left bundle-branch block, unspecified: Secondary | ICD-10-CM | POA: Diagnosis not present

## 2022-01-28 HISTORY — DX: Atherosclerosis of aorta: I70.0

## 2022-01-28 HISTORY — PX: COLONOSCOPY: SHX5424

## 2022-01-28 HISTORY — DX: Dyspnea, unspecified: R06.00

## 2022-01-28 HISTORY — DX: Presence of cardiac pacemaker: Z95.0

## 2022-01-28 HISTORY — DX: Emphysema, unspecified: J43.9

## 2022-01-28 HISTORY — DX: Atherosclerotic heart disease of native coronary artery without angina pectoris: I25.10

## 2022-01-28 HISTORY — DX: Unspecified atrial fibrillation: I48.91

## 2022-01-28 SURGERY — COLONOSCOPY
Anesthesia: General

## 2022-01-28 MED ORDER — LIDOCAINE HCL (CARDIAC) PF 100 MG/5ML IV SOSY
PREFILLED_SYRINGE | INTRAVENOUS | Status: DC | PRN
  Administered 2022-01-28: 50 mg via INTRAVENOUS

## 2022-01-28 MED ORDER — PROPOFOL 500 MG/50ML IV EMUL
INTRAVENOUS | Status: DC | PRN
  Administered 2022-01-28: 150 ug/kg/min via INTRAVENOUS

## 2022-01-28 MED ORDER — PROPOFOL 1000 MG/100ML IV EMUL
INTRAVENOUS | Status: AC
  Filled 2022-01-28: qty 100

## 2022-01-28 MED ORDER — SODIUM CHLORIDE 0.9 % IV SOLN: INTRAVENOUS | Status: DC

## 2022-01-28 MED ORDER — PROPOFOL 10 MG/ML IV BOLUS
INTRAVENOUS | Status: DC | PRN
  Administered 2022-01-28: 60 mg via INTRAVENOUS

## 2022-01-28 NOTE — Anesthesia Preprocedure Evaluation (Signed)
Anesthesia Evaluation  Patient identified by MRN, date of birth, ID band Patient awake    Reviewed: Allergy & Precautions, NPO status , Patient's Chart, lab work & pertinent test results  Airway Mallampati: II  TM Distance: >3 FB Neck ROM: full    Dental  (+) Missing   Pulmonary neg shortness of breath, COPD, Current Smoker and Patient abstained from smoking.,    Pulmonary exam normal        Cardiovascular + CAD  (-) Past MI + dysrhythmias Atrial Fibrillation + pacemaker      Neuro/Psych negative neurological ROS  negative psych ROS   GI/Hepatic negative GI ROS, Neg liver ROS,   Endo/Other  negative endocrine ROS  Renal/GU negative Renal ROS  negative genitourinary   Musculoskeletal   Abdominal   Peds  Hematology negative hematology ROS (+)   Anesthesia Other Findings Past Medical History: No date: A-fib ([place]) No date: Aortic atherosclerosis ([facility]) No date: Coronary artery disease No date: Dyspnea No date: Emphysema lung ([facility]) No date: Presence of permanent cardiac pacemaker  Past Surgical History: No date: INGUINAL HERNIA REPAIR 1987: KNEE ARTHROSCOPY; Right 10/03/2021: LEFT HEART CATH AND CORONARY ANGIOGRAPHY; N/A     Comment:  Procedure: LEFT HEART CATH AND CORONARY ANGIOGRAPHY;                Surgeon: Andrez Grime, MD;  Location: [facility];  Service: Cardiovascular;  Laterality:               N/A; 10/04/2021: PACEMAKER LEADLESS INSERTION; N/A     Comment:  Procedure: PACEMAKER LEADLESS INSERTION;  Surgeon:               Isaias Cowman, MD;  Location: [facility];  Service: Cardiovascular;  Laterality: N/A;  BMI    Body Mass Index: 20.22 kg/m      Reproductive/Obstetrics negative OB ROS                             Anesthesia Physical Anesthesia Plan  ASA: 3  Anesthesia Plan: General   Post-op Pain  Management: Minimal or no pain anticipated   Induction: Intravenous  PONV Risk Score and Plan: 3 and Propofol infusion, TIVA and Ondansetron  Airway Management Planned: Nasal Cannula  Additional Equipment: None  Intra-op Plan:   Post-operative Plan:   Informed Consent: I have reviewed the patients History and Physical, chart, labs and discussed the procedure including the risks, benefits and alternatives for the proposed anesthesia with the patient or authorized representative who has indicated his/her understanding and acceptance.     Dental advisory given  Plan Discussed with: CRNA and Surgeon  Anesthesia Plan Comments: (Discussed risks of anesthesia with patient, including possibility of difficulty with spontaneous ventilation under anesthesia necessitating airway intervention, PONV, and rare risks such as cardiac or respiratory or neurological events, and allergic reactions. Discussed the role of CRNA in patient's perioperative care. Patient understands.)        Anesthesia Quick Evaluation

## 2022-01-28 NOTE — Anesthesia Postprocedure Evaluation (Signed)
Anesthesia Post Note  Patient: Eric Allison  Procedure(s) Performed: COLONOSCOPY  Patient location during evaluation: PACU Anesthesia Type: General Level of consciousness: awake and alert Pain management: pain level controlled Vital Signs Assessment: post-procedure vital signs reviewed and stable Respiratory status: spontaneous breathing, nonlabored ventilation, respiratory function stable and patient connected to nasal cannula oxygen Cardiovascular status: blood pressure returned to baseline and stable Postop Assessment: no apparent nausea or vomiting Anesthetic complications: no   No notable events documented.   Last Vitals:  Vitals:   01/28/22 0930 01/28/22 0933  BP:    Pulse: (!) 51 (!) 51  Resp: 18 14  Temp:    SpO2: 100% (!) 2%    Last Pain:  Vitals:   01/28/22 0920  TempSrc: Temporal  PainSc:                  Dimas Millin

## 2022-01-28 NOTE — Op Note (Signed)
[facility] Patient Name: Eric Allison Procedure Date: 01/28/2022 8:25 AM MRN: [id number] Account #: [id number] Date of Birth: 1960-08-14 Admit Type: Outpatient Age: 61 Room: [facility] ENDO ROOM 2 Gender: Male Note Status: Finalized Instrument Name: Colonscope [id number] Procedure:             Colonoscopy Indications:           Screening for colorectal malignant neoplasm Providers:             Annamaria Helling DO, DO Medicines:             Monitored Anesthesia Care Complications:         No immediate complications. Estimated blood loss:                         Minimal. Procedure:             Pre-Anesthesia Assessment:                        - Prior to the procedure, a History and Physical was                         performed, and patient medications and allergies were                         reviewed. The patient is competent. The risks and                         benefits of the procedure and the sedation options and                         risks were discussed with the patient. All questions                         were answered and informed consent was obtained.                         Patient identification and proposed procedure were                         verified by the physician, the nurse, the anesthetist                         and the technician in the endoscopy suite. Mental                         Status Examination: alert and oriented. Airway                         Examination: normal oropharyngeal airway and neck                         mobility. Respiratory Examination: clear to                         auscultation. CV Examination: RRR, no murmurs, no S3                         or S4. Prophylactic Antibiotics: The patient does not  require prophylactic antibiotics. Prior                         Anticoagulants: The patient has taken no previous                         anticoagulant or antiplatelet agents  except for                         aspirin. ASA Grade Assessment: III - A patient with                         severe systemic disease. After reviewing the risks and                         benefits, the patient was deemed in satisfactory                         condition to undergo the procedure. The anesthesia                         plan was to use monitored anesthesia care (MAC).                         Immediately prior to administration of medications,                         the patient was re-assessed for adequacy to receive                         sedatives. The heart rate, respiratory rate, oxygen                         saturations, blood pressure, adequacy of pulmonary                         ventilation, and response to care were monitored                         throughout the procedure. The physical status of the                         patient was re-assessed after the procedure.                        After obtaining informed consent, the colonoscope was                         passed under direct vision. Throughout the procedure,                         the patient's blood pressure, pulse, and oxygen                         saturations were monitored continuously. The                         Colonoscope was introduced through the anus and  advanced to the the terminal ileum, with                         identification of the appendiceal orifice and IC                         valve. The colonoscopy was performed without                         difficulty. The patient tolerated the procedure well.                         The quality of the bowel preparation was evaluated                         using the BBPS [facility] Bowel Preparation Scale) with                         scores of: Right Colon = 3, Transverse Colon = 3 and                         Left Colon = 3 (entire mucosa seen well with no                         residual staining, small fragments  of stool or opaque                         liquid). The total BBPS score equals 9. The terminal                         ileum, ileocecal valve, appendiceal orifice, and                         rectum were photographed. Findings:      Skin tags were found on perianal exam.      The digital rectal exam was normal. Pertinent negatives include normal       sphincter tone.      The terminal ileum appeared normal. Estimated blood loss: none.      Non-bleeding internal hemorrhoids were found during retroflexion. The       hemorrhoids were Grade I (internal hemorrhoids that do not prolapse).       Estimated blood loss: none.      Multiple small-mouthed diverticula were found in the left colon.       Estimated blood loss: none.      Six sessile polyps were found in the rectum (1), sigmoid colon (2),       descending colon (2) and ascending colon (1). The polyps were 3 to 7 mm       in size. These polyps were removed with a cold snare. Resection and       retrieval were complete. Estimated blood loss was minimal. To prevent       bleeding after the polypectomy, one hemostatic clip was successfully       placed (MR conditional). There was no bleeding at the end of the       procedure. Placed on rectum cold snare polypectomy site      Three sessile polyps were found in the rectum, sigmoid  colon and       descending colon. The polyps were 1 to 2 mm in size. These polyps were       removed with a jumbo cold forceps. Resection and retrieval were       complete. Estimated blood loss was minimal.      A 10 to 12 mm polyp was found in the rectum. The polyp was pedunculated.       The polyp was removed with a hot snare. Resection and retrieval were       complete. Estimated blood loss was minimal.      The exam was otherwise without abnormality on direct and retroflexion       views. Impression:            - Perianal skin tags found on perianal exam.                        - The examined portion of the  ileum was normal.                        - Non-bleeding internal hemorrhoids.                        - Diverticulosis in the left colon.                        - Six 3 to 7 mm polyps in the rectum, in the sigmoid                         colon, in the descending colon and in the ascending                         colon, removed with a cold snare. Resected and                         retrieved. Clip (MR conditional) was placed.                        - Three 1 to 2 mm polyps in the rectum, in the sigmoid                         colon and in the descending colon, removed with a                         jumbo cold forceps. Resected and retrieved.                        - One 10 to 12 mm polyp in the rectum, removed with a                         hot snare. Resected and retrieved.                        - The examination was otherwise normal on direct and                         retroflexion views. Recommendation:        - Patient has a contact number available for  emergencies. The signs and symptoms of potential                         delayed complications were discussed with the patient.                         Return to normal activities tomorrow. Written                         discharge instructions were provided to the patient.                        - Discharge patient to home.                        - Resume previous diet.                        - Continue present medications.                        - No aspirin, ibuprofen, naproxen, or other                         non-steroidal anti-inflammatory drugs for 5 days after                         polyp removal.                        - Await pathology results.                        - Repeat colonoscopy in 1 year for surveillance of                         multiple polyps.                        - Return to referring physician as previously                         scheduled.                        - The findings  and recommendations were discussed with                         the patient. Procedure Code(s):     --- Professional ---                        [phone], Colonoscopy, flexible; with removal of                         tumor(s), polyp(s), or other lesion(s) by snare                         technique                        09983, 59, Colonoscopy, flexible; with biopsy, single  or multiple Diagnosis Code(s):     --- Professional ---                        Z12.11, Encounter for screening for malignant neoplasm                         of colon                        K64.0, First degree hemorrhoids                        K62.1, Rectal polyp                        K63.5, Polyp of colon                        K64.4, Residual hemorrhoidal skin tags                        K57.30, Diverticulosis of large intestine without                         perforation or abscess without bleeding CPT copyright 2019 [employer]. All rights reserved. The codes documented in this report are preliminary and upon coder review may  be revised to meet current compliance requirements. Attending Participation:      I personally performed the entire procedure. Volney American, DO Annamaria Helling DO, DO 01/28/2022 9:21:11 AM This report has been signed electronically. Number of Addenda: 0 Note Initiated On: 01/28/2022 8:25 AM Scope Withdrawal Time: 0 hours 37 minutes 53 seconds  Total Procedure Duration: 0 hours 40 minutes 42 seconds  Estimated Blood Loss:  Estimated blood loss was minimal.      [facility]

## 2022-01-28 NOTE — Transfer of Care (Signed)
Immediate Anesthesia Transfer of Care Note  Patient: Eric Allison  Procedure(s) Performed: COLONOSCOPY  Patient Location: Endoscopy Unit  Anesthesia Type:General  Level of Consciousness: drowsy and patient cooperative  Airway & Oxygen Therapy: Patient Spontanous Breathing and Patient connected to face mask oxygen  Post-op Assessment: Report given to RN and Post -op Vital signs reviewed and stable  Post vital signs: Reviewed and stable  Last Vitals:  Vitals Value Taken Time  BP 112/62 01/28/22 0918  Temp    Pulse 73 01/28/22 0918  Resp 19 01/28/22 0918  SpO2 91 % 01/28/22 0918  Vitals shown include unvalidated device data.  Last Pain:  Vitals:   01/28/22 0910  TempSrc: Temporal  PainSc:          Complications: No notable events documented.

## 2022-01-28 NOTE — Interval H&P Note (Signed)
History and Physical Interval Note: Preprocedure H&P from 01/28/22  was reviewed and there was no interval change after seeing and examining the patient.  Written consent was obtained from the patient after discussion of risks, benefits, and alternatives. Patient has consented to proceed with Colonoscopy with possible intervention   01/28/2022 8:21 AM  Eric Allison  has presented today for surgery, with the diagnosis of Colon cancer screening (Z12.11).  The various methods of treatment have been discussed with the patient and family. After consideration of risks, benefits and other options for treatment, the patient has consented to  Procedure(s): COLONOSCOPY (N/A) as a surgical intervention.  The patient's history has been reviewed, patient examined, no change in status, stable for surgery.  I have reviewed the patient's chart and labs.  Questions were answered to the patient's satisfaction.     Eric Allison

## 2022-01-29 ENCOUNTER — Encounter: Payer: Self-pay | Admitting: Gastroenterology

## 2022-01-29 LAB — SURGICAL PATHOLOGY

## 2022-02-01 ENCOUNTER — Encounter: Payer: Self-pay | Admitting: Acute Care

## 2022-02-12 ENCOUNTER — Encounter: Payer: Self-pay | Admitting: Acute Care

## 2022-03-12 ENCOUNTER — Encounter: Payer: Self-pay | Admitting: *Deleted

## 2022-07-22 ENCOUNTER — Ambulatory Visit
Admission: RE | Admit: 2022-07-22 | Discharge: 2022-07-22 | Disposition: A | Discharge status: Home / Self Care | Payer: Managed Care, Other (non HMO) | Source: Ambulatory Visit | Attending: Acute Care | Admitting: Acute Care

## 2022-07-22 ENCOUNTER — Encounter: Payer: Self-pay | Admitting: Internal Medicine

## 2022-07-22 DIAGNOSIS — F17210 Nicotine dependence, cigarettes, uncomplicated: Secondary | ICD-10-CM

## 2022-07-22 DIAGNOSIS — Z87891 Personal history of nicotine dependence: Secondary | ICD-10-CM

## 2022-07-23 ENCOUNTER — Other Ambulatory Visit: Payer: Self-pay | Admitting: Acute Care

## 2022-07-23 DIAGNOSIS — Z122 Encounter for screening for malignant neoplasm of respiratory organs: Secondary | ICD-10-CM

## 2022-07-23 DIAGNOSIS — Z87891 Personal history of nicotine dependence: Secondary | ICD-10-CM

## 2022-07-23 DIAGNOSIS — F17210 Nicotine dependence, cigarettes, uncomplicated: Secondary | ICD-10-CM

## 2022-08-14 IMAGING — CT CT CHEST LUNG CANCER SCREENING LOW DOSE W/O CM
2 of 5 series · 15 of 40 positions shown, 18 images · non-contrast
Comparison: 05/06/2021 chest radiograph.

CLINICAL DATA: 60-year-old asymptomatic male current smoker with 50
pack-year smoking history.



[Series 3: lung 1.00 · axial · 0.60mm/px · z∈[-1281,-929]mm · 12 of 388 slices shown, 15 images]
[im 18/388  mediastinal]
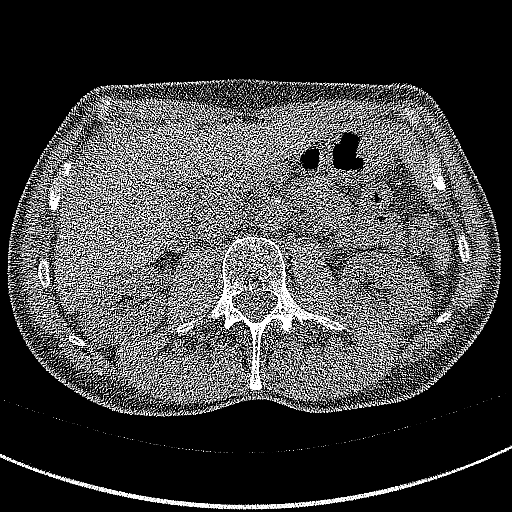
[im 18/388  lung]
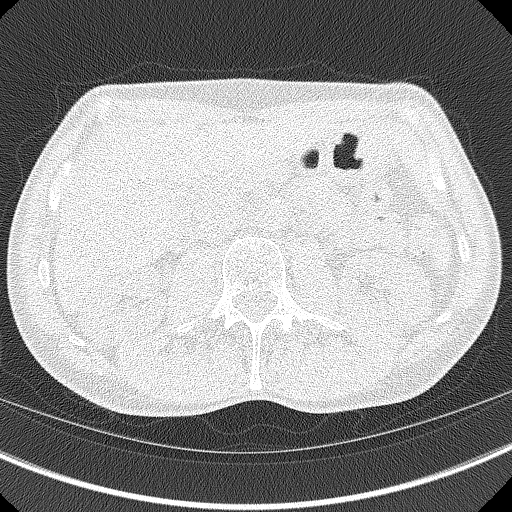
[im 53/388  lung]
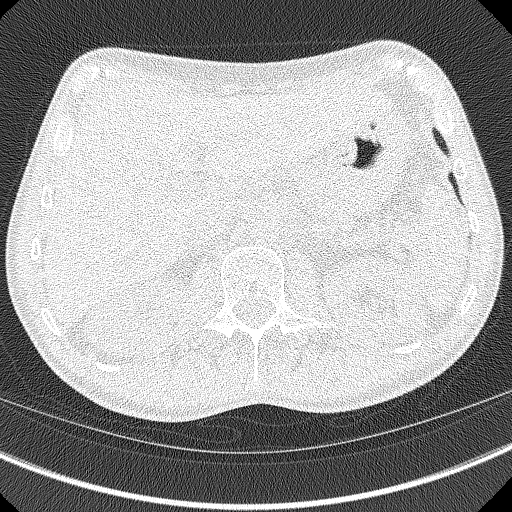
[im 88/388  lung]
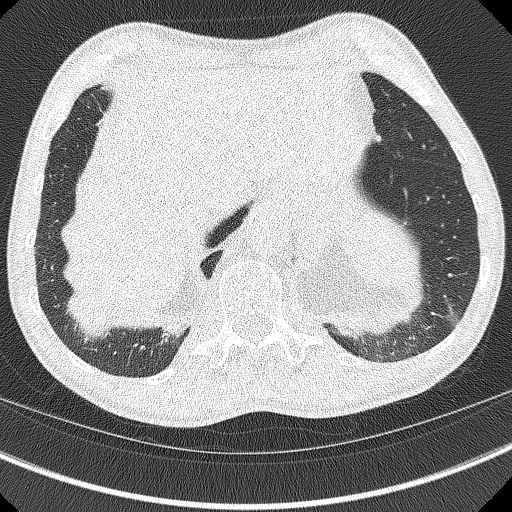
[im 124/388  lung]
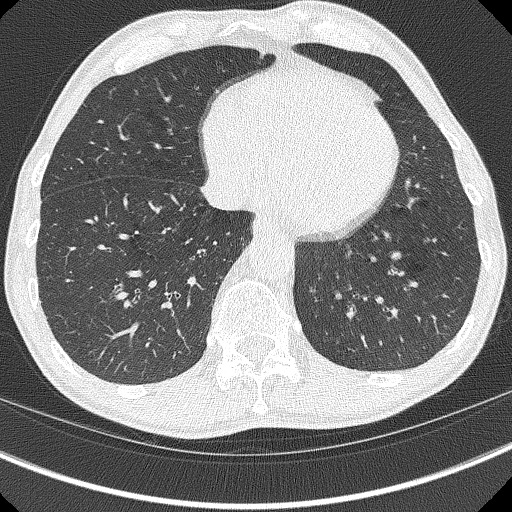
[im 141/388  mediastinal]
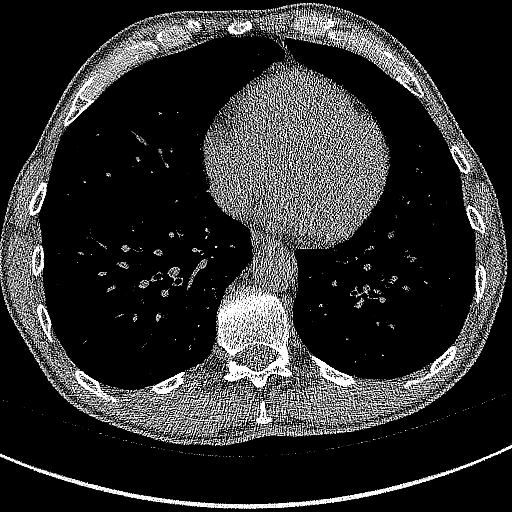
[im 141/388  lung]
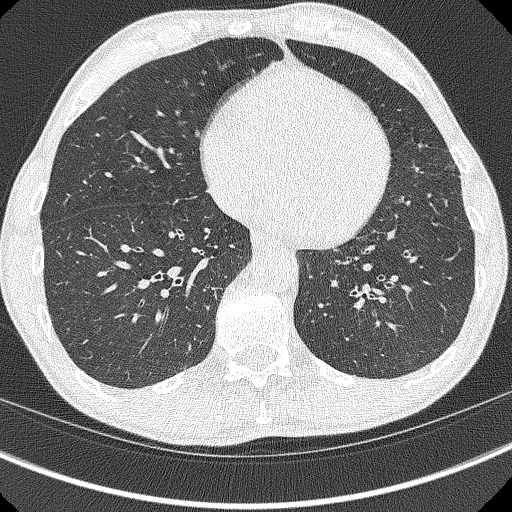
[im 176/388  lung]
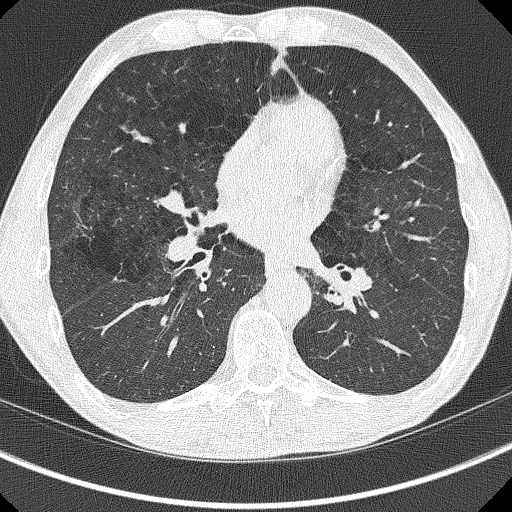
[im 212/388  lung]
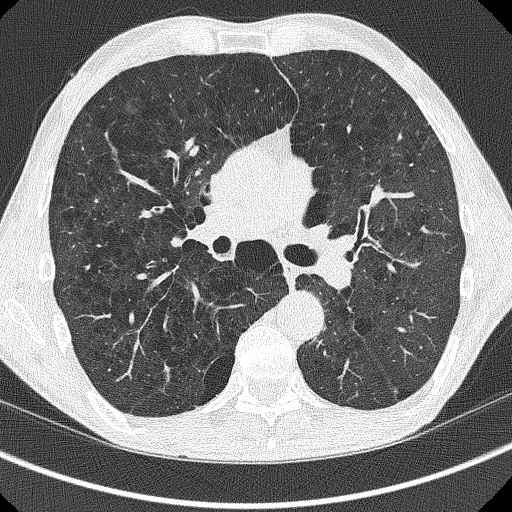
[im 247/388  lung]
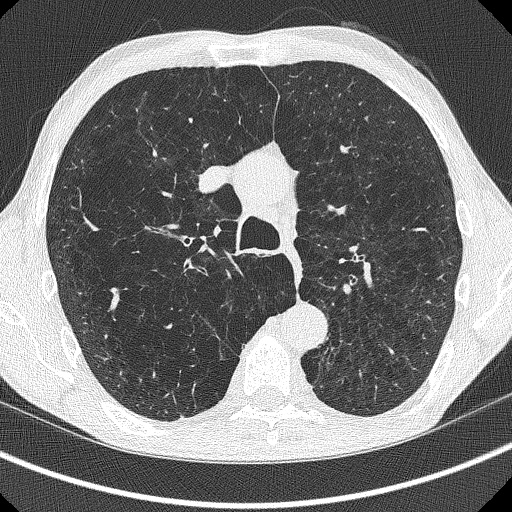
[im 264/388  mediastinal]
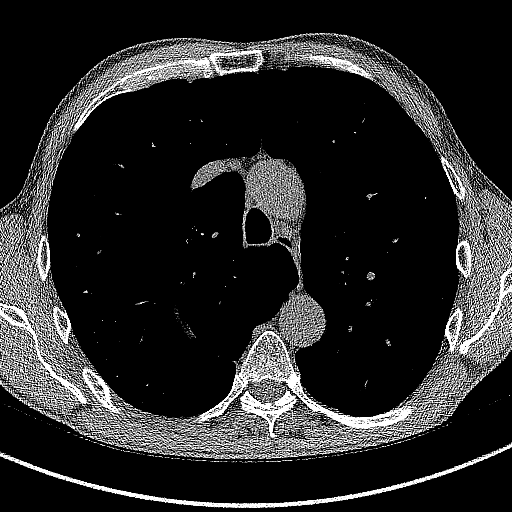
[im 264/388  lung]
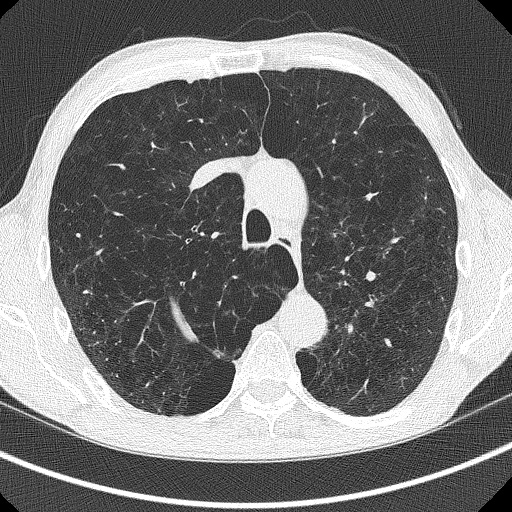
[im 300/388  lung]
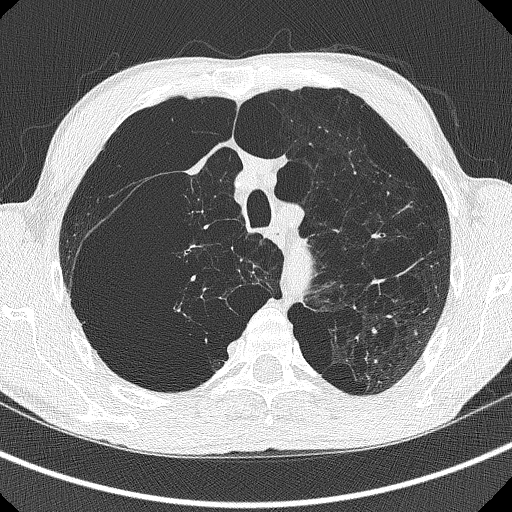
[im 335/388  lung]
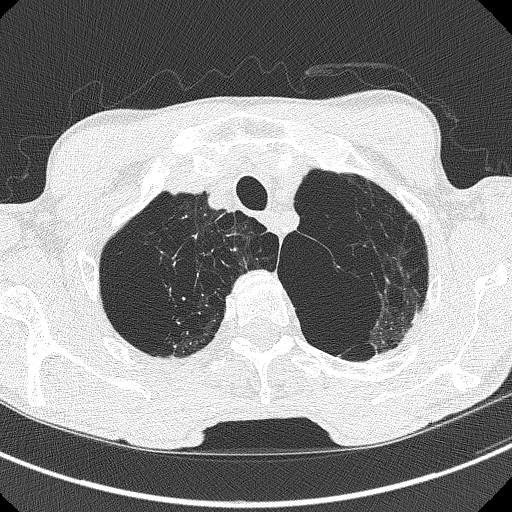
[im 370/388  lung]
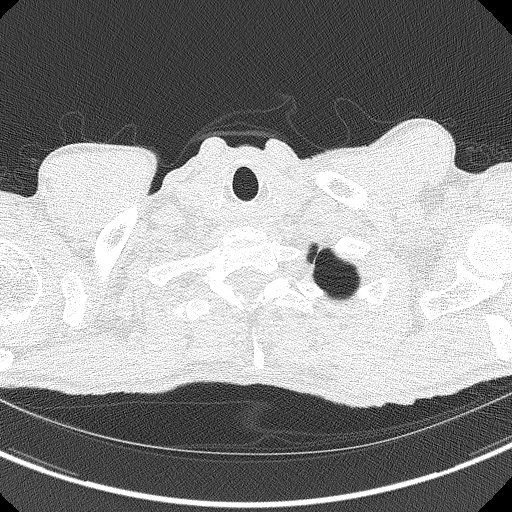

[Series 5: coronals lung 1.00 cor · coronal · 0.60mm/px · 3 of 308 slices shown]
[im 62/308  lung]
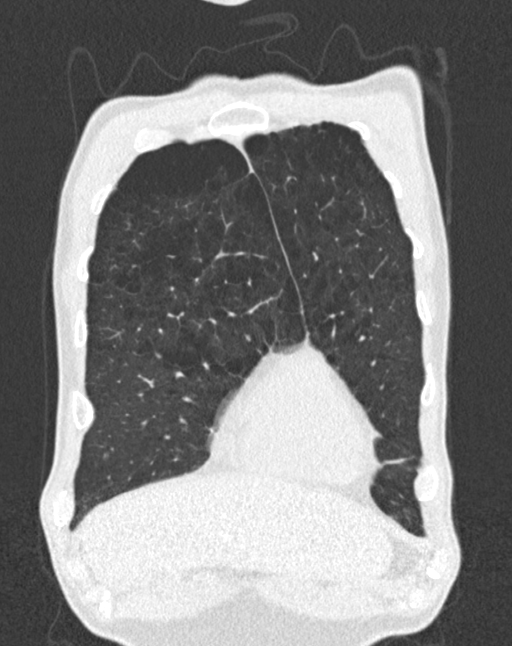
[im 123/308  lung]
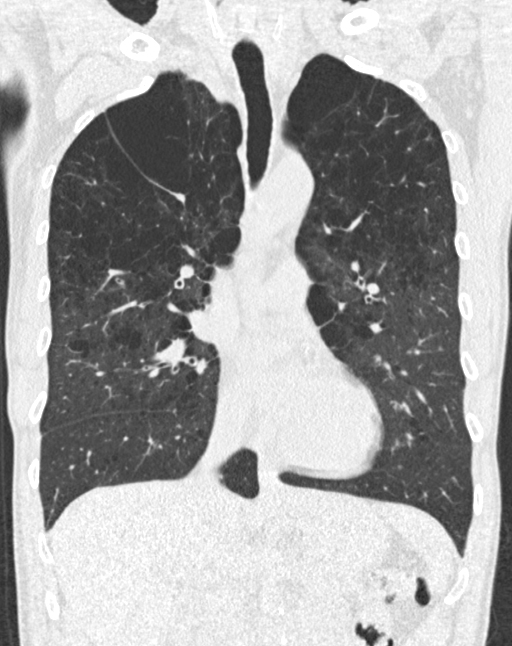
[im 185/308  lung]
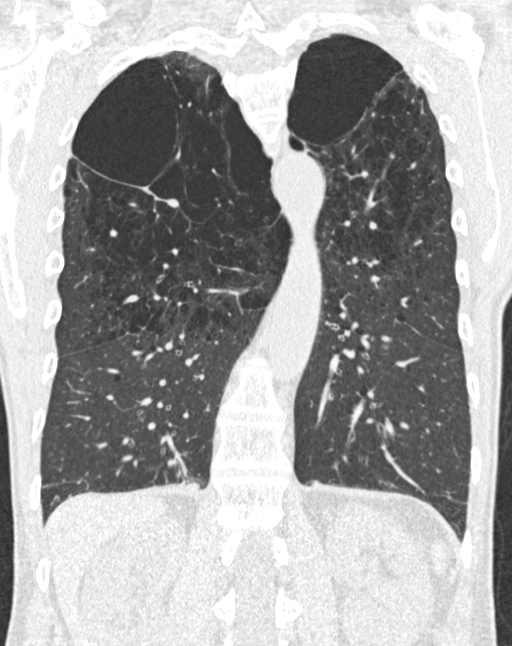

[15 of 40 positions shown; findings below may reference images not displayed]

FINDINGS: Cardiovascular: Normal heart size. No significant pericardial
effusion/thickening. Three-vessel coronary atherosclerosis.
Atherosclerotic nonaneurysmal thoracic aorta. Normal caliber
pulmonary arteries.

Mediastinum/Nodes: No discrete thyroid nodules. Unremarkable
esophagus. No pathologically enlarged axillary, mediastinal or hilar
lymph nodes, noting limited sensitivity for the detection of hilar
adenopathy on this noncontrast study.

Lungs/Pleura: No pneumothorax. No pleural effusion. Severe
centrilobular and apical bullous emphysema with diffuse bronchial
wall thickening. No acute consolidative airspace disease or lung
masses. A few scattered pulmonary nodules, largest in indistinct
right lower lobe nodule measuring 6.6 mm in volume derived mean
diameter (series 3/image 241).

Upper abdomen: No acute abnormality.

Musculoskeletal: No aggressive appearing focal osseous lesions. Mild
thoracic spondylosis. Chronic moderate T7 vertebral compression
fracture.
IMPRESSION: 1. Lung-RADS 3, probably benign findings. Short-term follow-up in 6
months is recommended with repeat low-dose chest CT without contrast
(please use the following order, "CT CHEST LCS NODULE FOLLOW-UP W/O
CM"). Dominant indistinct right lower lobe nodule measuring 6.6 mm
in volume derived mean diameter.
2. Three-vessel coronary atherosclerosis.
3. Aortic Atherosclerosis (26X7O-K5L.L) and Emphysema (26X7O-8JK.O).

## 2022-10-22 IMAGING — DX DG CHEST 1V PORT
1 series · 2 of 2 positions shown · non-contrast
Comparison: 05/06/2021

CLINICAL DATA: Chest pain and dizziness

EXAM:
PORTABLE CHEST 1 VIEW

[Series 1: chest ap · 0.14mm/px · 2 of 2 slices shown]
[im 1/2]
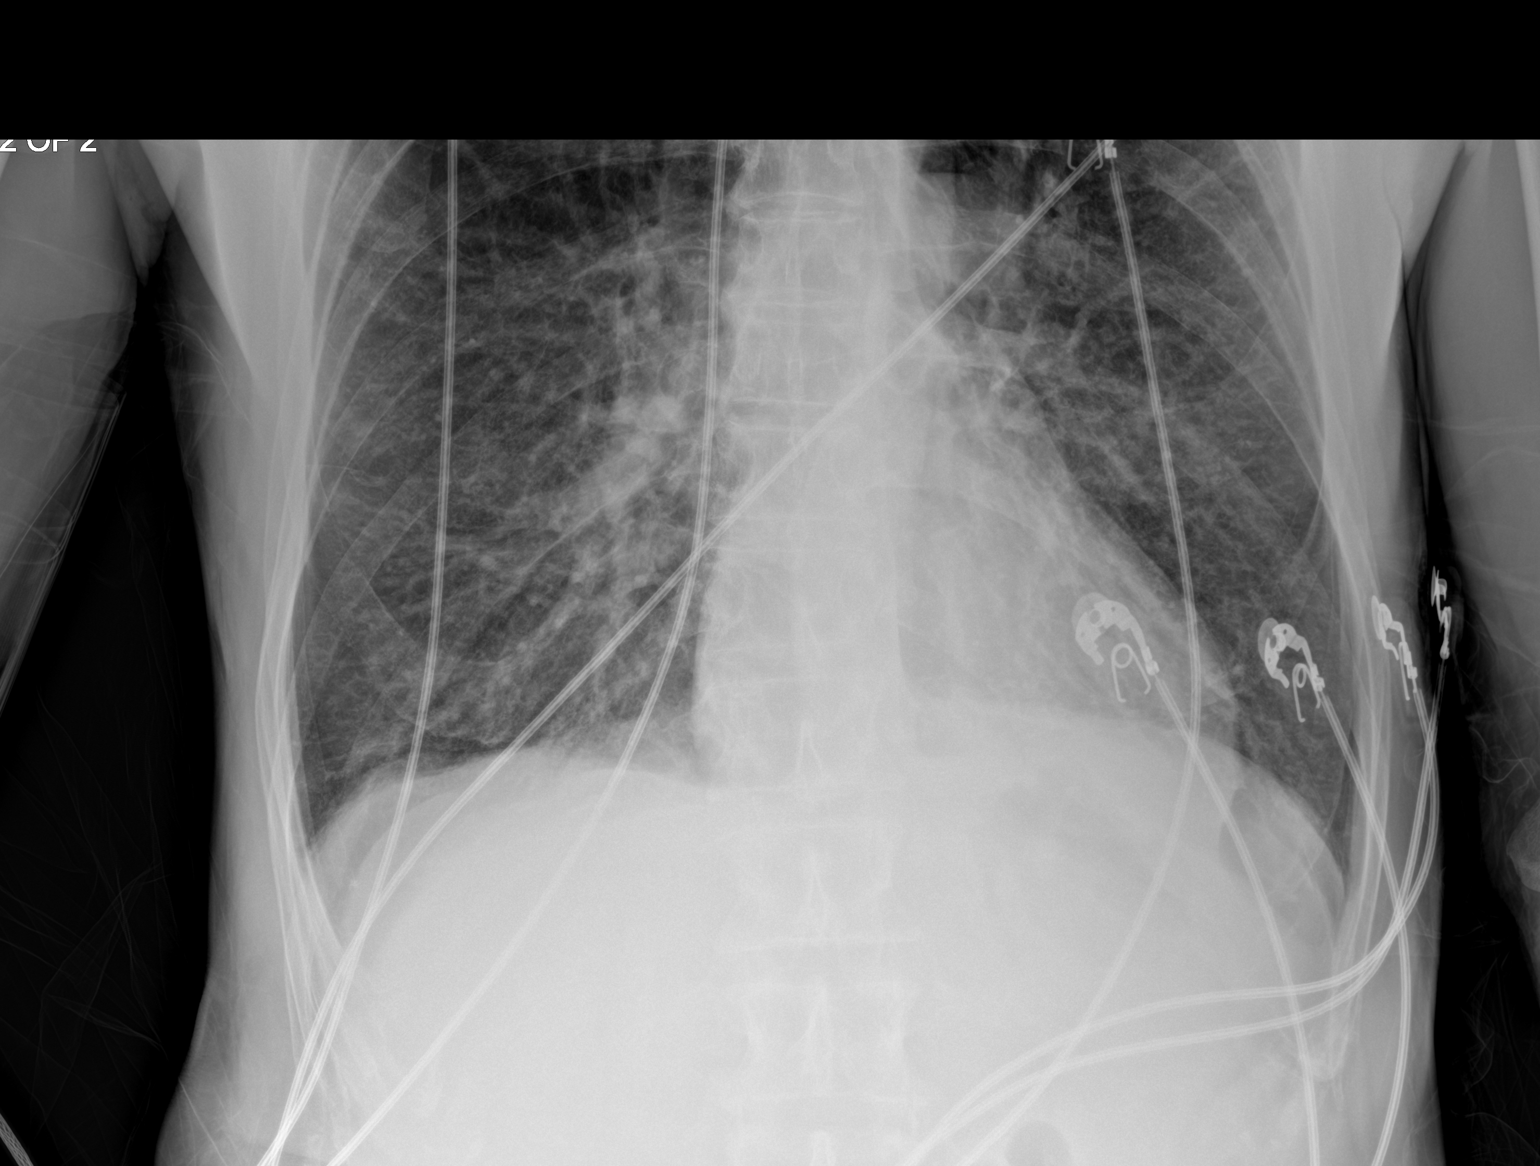
[im 2/2]
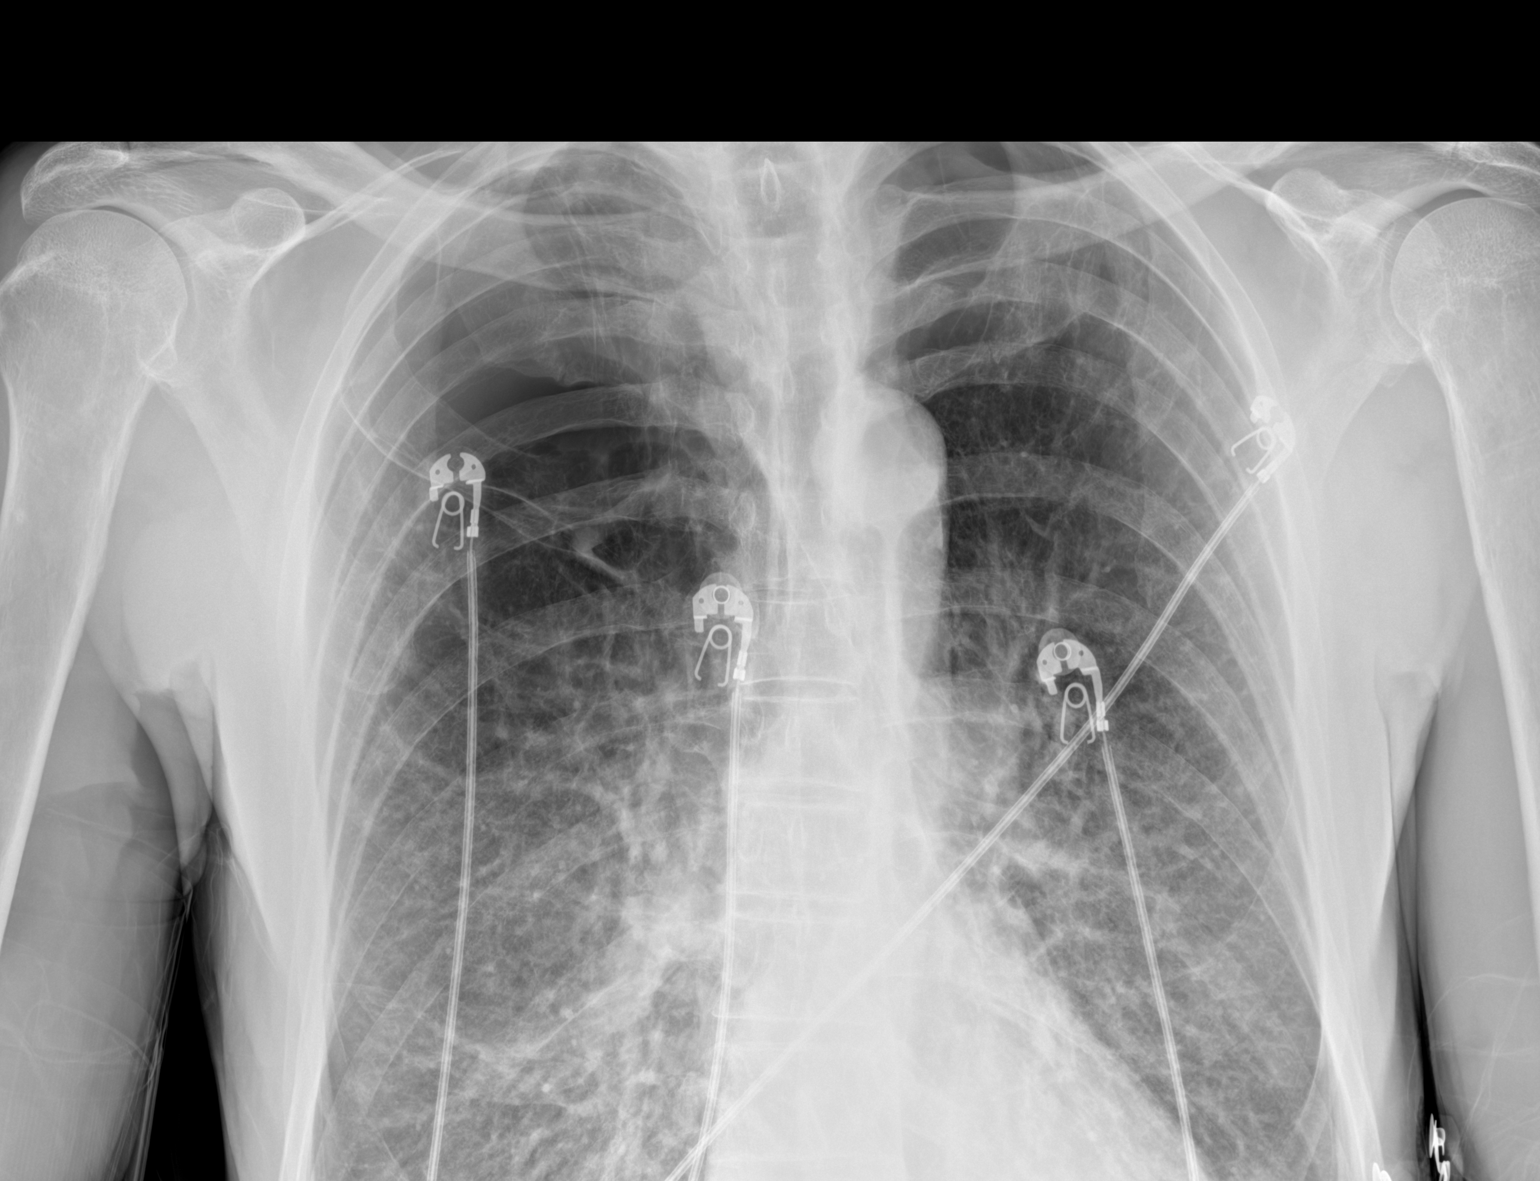

[2 of 2 positions shown; findings below may reference images not displayed]

FINDINGS: Heart size is normal. Mediastinal shadows are normal. Severe chronic
emphysema with bullous disease in the upper lungs right more than
left and crowding of markings at the bases. Question superimposed
interstitial pulmonary edema. No consolidation, collapse or
effusion.
IMPRESSION: Advanced emphysema. Bullous disease in the upper lobes, right worse
than left. Crowding of markings at the bases. Interstitial markings
appear more prominent than were seen in [REDACTED], raising the
possibility of superimposed interstitial edema.

## 2023-06-09 ENCOUNTER — Other Ambulatory Visit: Payer: Self-pay | Admitting: Acute Care

## 2023-06-09 DIAGNOSIS — Z87891 Personal history of nicotine dependence: Secondary | ICD-10-CM

## 2023-06-09 DIAGNOSIS — Z122 Encounter for screening for malignant neoplasm of respiratory organs: Secondary | ICD-10-CM

## 2023-06-09 DIAGNOSIS — F17210 Nicotine dependence, cigarettes, uncomplicated: Secondary | ICD-10-CM

## 2023-07-23 ENCOUNTER — Ambulatory Visit: Payer: 59

## 2023-07-24 ENCOUNTER — Inpatient Hospital Stay: Admission: RE | Admit: 2023-07-24 | Payer: 59 | Source: Ambulatory Visit

## 2023-08-04 ENCOUNTER — Encounter: Payer: Self-pay | Admitting: Emergency Medicine

## 2023-08-04 ENCOUNTER — Telehealth: Payer: Self-pay | Admitting: Acute Care

## 2023-08-04 NOTE — Telephone Encounter (Signed)
 ATC patient by phone but unable to reach him, VM bo is full. Primary number is work number with another person's name on the recorded voicemail box. Letter printed and mailed to patient to call us  to schedule annual scan.
